# Patient Record
Sex: Male | Born: 1995 | Race: Black or African American | Hispanic: No
Health system: Midwestern US, Community
[De-identification: ages and names within clinical notes are randomized; demographics above are authoritative.]

---
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
## 2015-02-01 ENCOUNTER — Encounter: Payer: Self-pay | Admitting: Family Medicine

## 2015-02-01 ENCOUNTER — Ambulatory Visit (INDEPENDENT_AMBULATORY_CARE_PROVIDER_SITE_OTHER): Payer: Medicaid Other | Admitting: Family Medicine

## 2015-02-01 VITALS — BP 116/60 | HR 53 | Temp 98.2°F | Resp 16 | Ht 71.5 in | Wt 164.0 lb

## 2015-02-01 DIAGNOSIS — Z0000 Encounter for general adult medical examination without abnormal findings: Secondary | ICD-10-CM | POA: Diagnosis not present

## 2015-02-01 NOTE — Patient Instructions (Signed)
Health Maintenance - 17-19 Years Old SCHOOL PERFORMANCE After high school, you may attend college or technical or vocational school, enroll in the [employer], or enter the workforce. PHYSICAL, SOCIAL, AND EMOTIONAL DEVELOPMENT  One hour of regular physical activity daily is recommended. Continue to participate in sports.  Develop your own interests and consider community service or volunteerism.  Make decisions about college and work plans.  Throughout these years, you should assume responsibility for your own health care. Increasing independence is important for you.  You may be exploring your sexual identity. Understand that you should never be in a situation that makes you feel uncomfortable, and tell your partner if you do not want to engage in sexual activity.  Body image may become important to you. Be mindful that eating disorders can develop at this time. Talk to your parents or other caregivers if you have concerns about body image, weight gain, or losing weight.  You may notice mood disturbances, depression, anxiety, attention problems, or trouble with alcohol. Talk to your health care provider if you have concerns about mental illness.  Set limits for yourself and talk with your parents or other caregivers about independent decision making.  Handle conflict without physical violence.  Avoid loud noises which may impair hearing.  Limit television and computer time to 2 hours each day. Individuals who engage in excessive inactivity are more likely to become overweight. RECOMMENDED IMMUNIZATIONS  Influenza vaccine.  All adults should be immunized every year.  All adults, including pregnant women and people with hives-only allergy to eggs, can receive the inactivated influenza (IIV) vaccine.  Adults aged 18-49 years can receive the recombinant influenza (RIV) vaccine. The RIV vaccine does not contain any egg protein.  Tetanus, diphtheria, and acellular pertussis (Td, Tdap)  vaccine.  Pregnant women should receive 1 dose of Tdap vaccine during each pregnancy. The dose should be obtained regardless of the length of time since the last dose. Immunization is preferred during the 27th to 36th week of gestation.  An adult who has not previously received Tdap or who does not know his or her vaccine status should receive 1 dose of Tdap. This initial dose should be followed by tetanus and diphtheria toxoids (Td) booster doses every 10 years.  Adults with an unknown or incomplete history of completing a 3-dose immunization series with Td-containing vaccines should begin or complete a primary immunization series including a Tdap dose.  Adults should receive a Td booster every 10 years.  Varicella vaccine.  An adult without evidence of immunity to varicella should receive 2 doses or a second dose if he or she has previously received 1 dose.  Pregnant females who do not have evidence of immunity should receive the first dose after pregnancy. This first dose should be obtained before leaving the health care facility. The second dose should be obtained 4-8 weeks after the first dose.  Human papillomavirus (HPV) vaccine.  Females aged 13-26 years who have not received the vaccine previously should obtain the 3-dose series.  The vaccine is not recommended for pregnant females. However, pregnancy testing is not needed before receiving a dose. If a male is found to be pregnant after receiving a dose, no treatment is needed. In that case, the remaining doses should be delayed until after the pregnancy.  Males aged 67-21 years who have not received the vaccine previously should receive the 3-dose series. Males aged 22-26 years may be immunized.  Immunization is recommended through the age of 40 years for any  male who has sex with males and did not get any or all doses earlier.  Immunization is recommended for any person with an immunocompromised condition through the age of 69  years if he or she did not get any or all doses earlier.  During the 3-dose series, the second dose should be obtained 4-8 weeks after the first dose. The third dose should be obtained 24 weeks after the first dose and 16 weeks after the second dose.  Measles, mumps, and rubella (MMR) vaccine.  Adults born in [birth year] or later should have 1 or more doses of MMR vaccine unless there is a contraindication to the vaccine or there is laboratory evidence of immunity to each of the three diseases.  A routine second dose of MMR vaccine should be obtained at least 28 days after the first dose for students attending postsecondary schools, health care workers, and international travelers.  For females of childbearing age, rubella immunity should be determined. If there is no evidence of immunity, females who are not pregnant should be vaccinated. If there is no evidence of immunity, females who are pregnant should delay immunization until after pregnancy.  Pneumococcal 13-valent conjugate (PCV13) vaccine.  When indicated, a person who is uncertain of his or her immunization history and has no record of immunization should receive the PCV13 vaccine.  An adult aged 31 years or older who has certain medical conditions and has not been previously immunized should receive 1 dose of PCV13 vaccine. This PCV13 should be followed with a dose of pneumococcal polysaccharide (PPSV23) vaccine. The PPSV23 vaccine dose should be obtained at least 8 weeks after the dose of PCV13 vaccine.  An adult aged 88 years or older who has certain medical conditions and previously received 1 or more doses of PPSV23 vaccine should receive 1 dose of PCV13. The PCV13 vaccine dose should be obtained 1 or more years after the last PPSV23 vaccine dose.  Pneumococcal polysaccharide (PPSV23) vaccine.  When PCV13 is also indicated, PCV13 should be obtained first.  An adult younger than age 19 years who has certain medical conditions should be  immunized.  Any person who resides in a long-term care facility should be immunized.  An adult smoker should be immunized.  People with an immunocompromised condition and certain other conditions should receive both PCV13 and PPSV23 vaccines.  People with human immunodeficiency virus (HIV) infection should be immunized as soon as possible after diagnosis.  Immunization during chemotherapy or radiation therapy should be avoided.  Routine use of PPSV23 vaccine is not recommended for American Indians, [place] Natives, or people younger than 65 years unless there are medical conditions that require PPSV23 vaccine.  When indicated, people who have unknown immunization and have no record of immunization should receive PPSV23 vaccine.  One-time revaccination 5 years after the first dose of PPSV23 is recommended for people aged 19-64 years who have chronic kidney failure, nephrotic syndrome, asplenia, or immunocompromised conditions.  Meningococcal vaccine.  Adults with asplenia or persistent complement component deficiencies should receive 2 doses of quadrivalent meningococcal conjugate (MenACWY-D) vaccine. The doses should be obtained at least 2 months apart.  Microbiologists working with certain meningococcal bacteria, [place] recruits, people at risk during an outbreak, and people who travel to or live in countries with a high rate of meningitis should be immunized.  A first-year college student up through age 60 years who is living in a residence hall should receive a dose if he or she did not receive a dose on  or after his or her 16th birthday.  Adults who have certain high-risk conditions should receive one or more doses of vaccine.  Hepatitis A vaccine.  Adults who wish to be protected from this disease, have certain high-risk conditions, work with hepatitis A-infected animals, work in hepatitis A research labs, or travel to or work in countries with a high rate of hepatitis A should be  immunized.  Adults who were previously unvaccinated and who anticipate close contact with an international adoptee during the first 60 days after arrival in the [place] from a country with a high rate of hepatitis A should be immunized.  Hepatitis B vaccine.  Adults who wish to be protected from this disease, have certain high-risk conditions, may be exposed to blood or other infectious body fluids, are household contacts or sex partners of hepatitis B positive people, are clients or workers in certain care facilities, or travel to or work in countries with a high rate of hepatitis B should be immunized.  Haemophilus influenzae type b (Hib) vaccine.  A previously unvaccinated person with asplenia or sickle cell disease or having a scheduled splenectomy should receive 1 dose of Hib vaccine.  Regardless of previous immunization, a recipient of a hematopoietic stem cell transplant should receive a 3-dose series 6-12 months after his or her successful transplant.  Hib vaccine is not recommended for adults with HIV infection. TESTING  Annual screening for vision and hearing problems is recommended. Vision should be screened at least once between 18-21 years of age.  You may be screened for anemia or tuberculosis.  You should have a blood test to check for high cholesterol.  You should be screened for alcohol and drug use.  If you are sexually active, you may be screened for sexually transmitted infections (STIs), pregnancy, or HIV. You should be screened for STIs if:  Your sexual activity has changed since the last screening test, and you are at an increased risk for chlamydia or gonorrhea. Ask your health care provider if you are at risk.  If you are at an increased risk for hepatitis B, you should be screened for this virus. You are considered at high risk for hepatitis B if you:  Were born in a country where hepatitis B occurs often. Talk with your health care provider about which  countries are considered high risk.  Have parents who were born in a high-risk country and have not received a shot to protect against hepatitis B (hepatitis B vaccine).  Have HIV or AIDS.  Use needles to inject street drugs.  Live with or have sex with someone who has hepatitis B.  Are a man who has sex with other men (MSM).  Get hemodialysis treatment.  Take certain medicines for conditions like cancer, organ transplantation, or autoimmune conditions. NUTRITION   You should:  Have three servings of low-fat milk and dairy products daily. If you do not drink milk or consume dairy products, you should eat calcium-enriched foods, such as juice, bread, or cereal. Dark, leafy greens or canned fish are alternate sources of calcium.  Drink plenty of water. Fruit juice should be limited to 8-12 oz (240-360 mL) each day. Sugary beverages and sodas should be avoided.  Avoid eating foods high in fat, salt, or sugar, such as chips, candy, and cookies.  Avoid fast foods and limit eating out at restaurants.  Try not to skip meals, especially breakfast. You should eat a variety of vegetables, fruits, and lean meats.  Eat meals   together as a family whenever possible. ORAL HEALTH Brush your teeth twice a day and floss at least once a day. You should have two dental exams a year.  SKIN CARE You should wear sunscreen when out in the sun. TALK TO SOMEONE ABOUT:  Precautions against pregnancy, contraception, and sexually transmitted infections.  Taking a prescription medicine daily to prevent HIV infection if you are at risk of being infected with HIV. This is called preexposure prophylaxis (PrEP). You are at risk if you:  Are a male who has sex with other males (MSM).  Are heterosexual and sexually active with more than one partner.  Take drugs by injection.  Are sexually active with a partner who has HIV.  Whether you are at high risk of being infected with HIV. If you choose to begin  PrEP, you should first be tested for HIV. You should then be tested every 3 months for as long as you are taking PrEP.  Drug, tobacco, and alcohol use among your friends or at friends' homes. Smoking tobacco or marijuana and taking drugs have health consequences and may impact your brain development.  Appropriate use of over-the-counter or prescription medicines.  Driving guidelines and riding with friends.  The risks of drinking and driving or boating. Call someone if you have been drinking or using drugs and need a ride. WHAT'S NEXT? Visit your pediatrician or family physician once a year. By young adulthood, you should transition from your pediatrician to a family physician or internal medicine specialist. If you are a male and are sexually active, you may want to begin annual physical exams with a gynecologist. Document Released: 01/30/2007 Document Revised: 11/09/2013 Document Reviewed: 02/19/2007 [facility] Patient Information 2015 [place], [place]. This information is not intended to replace advice given to you by your health care provider. Make sure you discuss any questions you have with your health care provider. Preventive Care for Adults A healthy lifestyle and preventive care can promote health and wellness. Preventive health guidelines for men include the following key practices:  A routine yearly physical is a good way to check with your health care provider about your health and preventative screening. It is a chance to share any concerns and updates on your health and to receive a thorough exam.  Visit your dentist for a routine exam and preventative care every 6 months. Brush your teeth twice a day and floss once a day. Good oral hygiene prevents tooth decay and gum disease.  The frequency of eye exams is based on your age, health, family medical history, use of contact lenses, and other factors. Follow your health care provider's recommendations for frequency of eye  exams.  Eat a healthy diet. Foods such as vegetables, fruits, whole grains, low-fat dairy products, and lean protein foods contain the nutrients you need without too many calories. Decrease your intake of foods high in solid fats, added sugars, and salt. Eat the right amount of calories for you.Get information about a proper diet from your health care provider, if necessary.  Regular physical exercise is one of the most important things you can do for your health. Most adults should get at least 150 minutes of moderate-intensity exercise (any activity that increases your heart rate and causes you to sweat) each week. In addition, most adults need muscle-strengthening exercises on 2 or more days a week.  Maintain a healthy weight. The body mass index (BMI) is a screening tool to identify possible weight problems. It provides an estimate of body  fat based on height and weight. Your health care provider can find your BMI and can help you achieve or maintain a healthy weight.For adults 20 years and older:  A BMI below 18.5 is considered underweight.  A BMI of 18.5 to 24.9 is normal.  A BMI of 25 to 29.9 is considered overweight.  A BMI of 30 and above is considered obese.  Maintain normal blood lipids and cholesterol levels by exercising and minimizing your intake of saturated fat. Eat a balanced diet with plenty of fruit and vegetables. Blood tests for lipids and cholesterol should begin at age 70 and be repeated every 5 years. If your lipid or cholesterol levels are high, you are over 50, or you are at high risk for heart disease, you may need your cholesterol levels checked more frequently.Ongoing high lipid and cholesterol levels should be treated with medicines if diet and exercise are not working.  If you smoke, find out from your health care provider how to quit. If you do not use tobacco, do not start.  Lung cancer screening is recommended for adults aged 58-80 years who are at high risk  for developing lung cancer because of a history of smoking. A yearly low-dose CT scan of the lungs is recommended for people who have at least a 30-pack-year history of smoking and are a current smoker or have quit within the past 15 years. A pack year of smoking is smoking an average of 1 pack of cigarettes a day for 1 year (for example: 1 pack a day for 30 years or 2 packs a day for 15 years). Yearly screening should continue until the smoker has stopped smoking for at least 15 years. Yearly screening should be stopped for people who develop a health problem that would prevent them from having lung cancer treatment.  If you choose to drink alcohol, do not have more than 2 drinks per day. One drink is considered to be 12 ounces (355 mL) of beer, 5 ounces (148 mL) of wine, or 1.5 ounces (44 mL) of liquor.  Avoid use of street drugs. Do not share needles with anyone. Ask for help if you need support or instructions about stopping the use of drugs.  High blood pressure causes heart disease and increases the risk of stroke. Your blood pressure should be checked at least every 1-2 years. Ongoing high blood pressure should be treated with medicines, if weight loss and exercise are not effective.  If you are 71-37 years old, ask your health care provider if you should take aspirin to prevent heart disease.  Diabetes screening involves taking a blood sample to check your fasting blood sugar level. This should be done once every 3 years, after age 50, if you are within normal weight and without risk factors for diabetes. Testing should be considered at a younger age or be carried out more frequently if you are overweight and have at least 1 risk factor for diabetes.  Colorectal cancer can be detected and often prevented. Most routine colorectal cancer screening begins at the age of 77 and continues through age 94. However, your health care provider may recommend screening at an earlier age if you have risk  factors for colon cancer. On a yearly basis, your health care provider may provide home test kits to check for hidden blood in the stool. Use of a small camera at the end of a tube to directly examine the colon (sigmoidoscopy or colonoscopy) can detect the earliest forms of  colorectal cancer. Talk to your health care provider about this at age 70, when routine screening begins. Direct exam of the colon should be repeated every 5-10 years through age 41, unless early forms of precancerous polyps or small growths are found.  People who are at an increased risk for hepatitis B should be screened for this virus. You are considered at high risk for hepatitis B if:  You were born in a country where hepatitis B occurs often. Talk with your health care provider about which countries are considered high risk.  Your parents were born in a high-risk country and you have not received a shot to protect against hepatitis B (hepatitis B vaccine).  You have HIV or AIDS.  You use needles to inject street drugs.  You live with, or have sex with, someone who has hepatitis B.  You are a man who has sex with other men (MSM).  You get hemodialysis treatment.  You take certain medicines for conditions such as cancer, organ transplantation, and autoimmune conditions.  Hepatitis C blood testing is recommended for all people born from [birth year] through 1965 and any individual with known risks for hepatitis C.  Practice safe sex. Use condoms and avoid high-risk sexual practices to reduce the spread of sexually transmitted infections (STIs). STIs include gonorrhea, chlamydia, syphilis, trichomonas, herpes, HPV, and human immunodeficiency virus (HIV). Herpes, HIV, and HPV are viral illnesses that have no cure. They can result in disability, cancer, and death.  If you are at risk of being infected with HIV, it is recommended that you take a prescription medicine daily to prevent HIV infection. This is called preexposure  prophylaxis (PrEP). You are considered at risk if:  You are a man who has sex with other men (MSM) and have other risk factors.  You are a heterosexual man, are sexually active, and are at increased risk for HIV infection.  You take drugs by injection.  You are sexually active with a partner who has HIV.  Talk with your health care provider about whether you are at high risk of being infected with HIV. If you choose to begin PrEP, you should first be tested for HIV. You should then be tested every 3 months for as long as you are taking PrEP.  A one-time screening for abdominal aortic aneurysm (AAA) and surgical repair of large AAAs by ultrasound are recommended for men ages 65 to 39 years who are current or former smokers.  Healthy men should no longer receive prostate-specific antigen (PSA) blood tests as part of routine cancer screening. Talk with your health care provider about prostate cancer screening.  Testicular cancer screening is not recommended for adult males who have no symptoms. Screening includes self-exam, a health care provider exam, and other screening tests. Consult with your health care provider about any symptoms you have or any concerns you have about testicular cancer.  Use sunscreen. Apply sunscreen liberally and repeatedly throughout the day. You should seek shade when your shadow is shorter than you. Protect yourself by wearing long sleeves, pants, a wide-brimmed hat, and sunglasses year round, whenever you are outdoors.  Once a month, do a whole-body skin exam, using a mirror to look at the skin on your back. Tell your health care provider about new moles, moles that have irregular borders, moles that are larger than a pencil eraser, or moles that have changed in shape or color.  Stay current with required vaccines (immunizations).  Influenza vaccine. All adults should be immunized  every year.  Tetanus, diphtheria, and acellular pertussis (Td, Tdap) vaccine. An  adult who has not previously received Tdap or who does not know his vaccine status should receive 1 dose of Tdap. This initial dose should be followed by tetanus and diphtheria toxoids (Td) booster doses every 10 years. Adults with an unknown or incomplete history of completing a 3-dose immunization series with Td-containing vaccines should begin or complete a primary immunization series including a Tdap dose. Adults should receive a Td booster every 10 years.  Varicella vaccine. An adult without evidence of immunity to varicella should receive 2 doses or a second dose if he has previously received 1 dose.  Human papillomavirus (HPV) vaccine. Males aged 100-21 years who have not received the vaccine previously should receive the 3-dose series. Males aged 22-26 years may be immunized. Immunization is recommended through the age of 17 years for any male who has sex with males and did not get any or all doses earlier. Immunization is recommended for any person with an immunocompromised condition through the age of 50 years if he did not get any or all doses earlier. During the 3-dose series, the second dose should be obtained 4-8 weeks after the first dose. The third dose should be obtained 24 weeks after the first dose and 16 weeks after the second dose.  Zoster vaccine. One dose is recommended for adults aged 68 years or older unless certain conditions are present.  Measles, mumps, and rubella (MMR) vaccine. Adults born before [birth year] generally are considered immune to measles and mumps. Adults born in [birth year] or later should have 1 or more doses of MMR vaccine unless there is a contraindication to the vaccine or there is laboratory evidence of immunity to each of the three diseases. A routine second dose of MMR vaccine should be obtained at least 28 days after the first dose for students attending postsecondary schools, health care workers, or international travelers. People who received inactivated measles vaccine  or an unknown type of measles vaccine during 1963-1967 should receive 2 doses of MMR vaccine. People who received inactivated mumps vaccine or an unknown type of mumps vaccine before 1979 and are at high risk for mumps infection should consider immunization with 2 doses of MMR vaccine. Unvaccinated health care workers born before [birth year] who lack laboratory evidence of measles, mumps, or rubella immunity or laboratory confirmation of disease should consider measles and mumps immunization with 2 doses of MMR vaccine or rubella immunization with 1 dose of MMR vaccine.  Pneumococcal 13-valent conjugate (PCV13) vaccine. When indicated, a person who is uncertain of his immunization history and has no record of immunization should receive the PCV13 vaccine. An adult aged 51 years or older who has certain medical conditions and has not been previously immunized should receive 1 dose of PCV13 vaccine. This PCV13 should be followed with a dose of pneumococcal polysaccharide (PPSV23) vaccine. The PPSV23 vaccine dose should be obtained at least 8 weeks after the dose of PCV13 vaccine. An adult aged 90 years or older who has certain medical conditions and previously received 1 or more doses of PPSV23 vaccine should receive 1 dose of PCV13. The PCV13 vaccine dose should be obtained 1 or more years after the last PPSV23 vaccine dose.  Pneumococcal polysaccharide (PPSV23) vaccine. When PCV13 is also indicated, PCV13 should be obtained first. All adults aged 30 years and older should be immunized. An adult younger than age 33 years who has certain medical conditions should be immunized. Any person  who resides in a nursing home or long-term care facility should be immunized. An adult smoker should be immunized. People with an immunocompromised condition and certain other conditions should receive both PCV13 and PPSV23 vaccines. People with human immunodeficiency virus (HIV) infection should be immunized as soon as possible after  diagnosis. Immunization during chemotherapy or radiation therapy should be avoided. Routine use of PPSV23 vaccine is not recommended for American Indians, [place] Natives, or people younger than 65 years unless there are medical conditions that require PPSV23 vaccine. When indicated, people who have unknown immunization and have no record of immunization should receive PPSV23 vaccine. One-time revaccination 5 years after the first dose of PPSV23 is recommended for people aged 19-64 years who have chronic kidney failure, nephrotic syndrome, asplenia, or immunocompromised conditions. People who received 1-2 doses of PPSV23 before age 45 years should receive another dose of PPSV23 vaccine at age 60 years or later if at least 5 years have passed since the previous dose. Doses of PPSV23 are not needed for people immunized with PPSV23 at or after age 30 years.  Meningococcal vaccine. Adults with asplenia or persistent complement component deficiencies should receive 2 doses of quadrivalent meningococcal conjugate (MenACWY-D) vaccine. The doses should be obtained at least 2 months apart. Microbiologists working with certain meningococcal bacteria, [employer] recruits, people at risk during an outbreak, and people who travel to or live in countries with a high rate of meningitis should be immunized. A first-year college student up through age 75 years who is living in a residence hall should receive a dose if he did not receive a dose on or after his 16th birthday. Adults who have certain high-risk conditions should receive one or more doses of vaccine.  Hepatitis A vaccine. Adults who wish to be protected from this disease, have certain high-risk conditions, work with hepatitis A-infected animals, work in hepatitis A research labs, or travel to or work in countries with a high rate of hepatitis A should be immunized. Adults who were previously unvaccinated and who anticipate close contact with an international adoptee  during the first 60 days after arrival in the [place] from a country with a high rate of hepatitis A should be immunized.  Hepatitis B vaccine. Adults should be immunized if they wish to be protected from this disease, have certain high-risk conditions, may be exposed to blood or other infectious body fluids, are household contacts or sex partners of hepatitis B positive people, are clients or workers in certain care facilities, or travel to or work in countries with a high rate of hepatitis B.  Haemophilus influenzae type b (Hib) vaccine. A previously unvaccinated person with asplenia or sickle cell disease or having a scheduled splenectomy should receive 1 dose of Hib vaccine. Regardless of previous immunization, a recipient of a hematopoietic stem cell transplant should receive a 3-dose series 6-12 months after his successful transplant. Hib vaccine is not recommended for adults with HIV infection. Preventive Service / Frequency Ages 4 to 70  Blood pressure check.** / Every 1 to 2 years.  Lipid and cholesterol check.** / Every 5 years beginning at age 60.  Hepatitis C blood test.** / For any individual with known risks for hepatitis C.  Skin self-exam. / Monthly.  Influenza vaccine. / Every year.  Tetanus, diphtheria, and acellular pertussis (Tdap, Td) vaccine.** / Consult your health care provider. 1 dose of Td every 10 years.  Varicella vaccine.** / Consult your health care provider.  HPV vaccine. / 3 doses  over 6 months, if 75 or younger.  Measles, mumps, rubella (MMR) vaccine.** / You need at least 1 dose of MMR if you were born in 1957 or later. You may also need a second dose.  Pneumococcal 13-valent conjugate (PCV13) vaccine.** / Consult your health care provider.  Pneumococcal polysaccharide (PPSV23) vaccine.** / 1 to 2 doses if you smoke cigarettes or if you have certain conditions.  Meningococcal vaccine.** / 1 dose if you are age 75 to 13 years and a Occupational psychologist living in a residence hall, or have one of several medical conditions. You may also need additional booster doses.  Hepatitis A vaccine.** / Consult your health care provider.  Hepatitis B vaccine.** / Consult your health care provider.  Haemophilus influenzae type b (Hib) vaccine.** / Consult your health care provider. Ages 46 to 2  Blood pressure check.** / Every 1 to 2 years.  Lipid and cholesterol check.** / Every 5 years beginning at age 55.  Lung cancer screening. / Every year if you are aged 72-80 years and have a 30-pack-year history of smoking and currently smoke or have quit within the past 15 years. Yearly screening is stopped once you have quit smoking for at least 15 years or develop a health problem that would prevent you from having lung cancer treatment.  Fecal occult blood test (FOBT) of stool. / Every year beginning at age 12 and continuing until age 64. You may not have to do this test if you get a colonoscopy every 10 years.  Flexible sigmoidoscopy** or colonoscopy.** / Every 5 years for a flexible sigmoidoscopy or every 10 years for a colonoscopy beginning at age 48 and continuing until age 106.  Hepatitis C blood test.** / For all people born from [birth year] through 1965 and any individual with known risks for hepatitis C.  Skin self-exam. / Monthly.  Influenza vaccine. / Every year.  Tetanus, diphtheria, and acellular pertussis (Tdap/Td) vaccine.** / Consult your health care provider. 1 dose of Td every 10 years.  Varicella vaccine.** / Consult your health care provider.  Zoster vaccine.** / 1 dose for adults aged 9 years or older.  Measles, mumps, rubella (MMR) vaccine.** / You need at least 1 dose of MMR if you were born in 1957 or later. You may also need a second dose.  Pneumococcal 13-valent conjugate (PCV13) vaccine.** / Consult your health care provider.  Pneumococcal polysaccharide (PPSV23) vaccine.** / 1 to 2 doses if you smoke cigarettes  or if you have certain conditions.  Meningococcal vaccine.** / Consult your health care provider.  Hepatitis A vaccine.** / Consult your health care provider.  Hepatitis B vaccine.** / Consult your health care provider.  Haemophilus influenzae type b (Hib) vaccine.** / Consult your health care provider. Ages 46 and over  Blood pressure check.** / Every 1 to 2 years.  Lipid and cholesterol check.**/ Every 5 years beginning at age 51.  Lung cancer screening. / Every year if you are aged 80-80 years and have a 30-pack-year history of smoking and currently smoke or have quit within the past 15 years. Yearly screening is stopped once you have quit smoking for at least 15 years or develop a health problem that would prevent you from having lung cancer treatment.  Fecal occult blood test (FOBT) of stool. / Every year beginning at age 71 and continuing until age 26. You may not have to do this test if you get a colonoscopy every 10 years.  Flexible sigmoidoscopy** or colonoscopy.** /  Every 5 years for a flexible sigmoidoscopy or every 10 years for a colonoscopy beginning at age 63 and continuing until age 91.  Hepatitis C blood test.** / For all people born from [birth year] through 1965 and any individual with known risks for hepatitis C.  Abdominal aortic aneurysm (AAA) screening.** / A one-time screening for ages 34 to 67 years who are current or former smokers.  Skin self-exam. / Monthly.  Influenza vaccine. / Every year.  Tetanus, diphtheria, and acellular pertussis (Tdap/Td) vaccine.** / 1 dose of Td every 10 years.  Varicella vaccine.** / Consult your health care provider.  Zoster vaccine.** / 1 dose for adults aged 45 years or older.  Pneumococcal 13-valent conjugate (PCV13) vaccine.** / Consult your health care provider.  Pneumococcal polysaccharide (PPSV23) vaccine.** / 1 dose for all adults aged 34 years and older.  Meningococcal vaccine.** / Consult your health care  provider.  Hepatitis A vaccine.** / Consult your health care provider.  Hepatitis B vaccine.** / Consult your health care provider.  Haemophilus influenzae type b (Hib) vaccine.** / Consult your health care provider. **Family history and personal history of risk and conditions may change your health care provider's recommendations. Document Released: 12/31/2001 Document Revised: 11/09/2013 Document Reviewed: 04/01/2011 [facility] Patient Information 2015 [place], [place]. This information is not intended to replace advice given to you by your health care provider. Make sure you discuss any questions you have with your health care provider. Preventive Care for Adults A healthy lifestyle and preventive care can promote health and wellness. Preventive health guidelines for men include the following key practices:  A routine yearly physical is a good way to check with your health care provider about your health and preventative screening. It is a chance to share any concerns and updates on your health and to receive a thorough exam.  Visit your dentist for a routine exam and preventative care every 6 months. Brush your teeth twice a day and floss once a day. Good oral hygiene prevents tooth decay and gum disease.  The frequency of eye exams is based on your age, health, family medical history, use of contact lenses, and other factors. Follow your health care provider's recommendations for frequency of eye exams.  Eat a healthy diet. Foods such as vegetables, fruits, whole grains, low-fat dairy products, and lean protein foods contain the nutrients you need without too many calories. Decrease your intake of foods high in solid fats, added sugars, and salt. Eat the right amount of calories for you.Get information about a proper diet from your health care provider, if necessary.  Regular physical exercise is one of the most important things you can do for your health. Most adults should get at least 150  minutes of moderate-intensity exercise (any activity that increases your heart rate and causes you to sweat) each week. In addition, most adults need muscle-strengthening exercises on 2 or more days a week.  Maintain a healthy weight. The body mass index (BMI) is a screening tool to identify possible weight problems. It provides an estimate of body fat based on height and weight. Your health care provider can find your BMI and can help you achieve or maintain a healthy weight.For adults 20 years and older:  A BMI below 18.5 is considered underweight.  A BMI of 18.5 to 24.9 is normal.  A BMI of 25 to 29.9 is considered overweight.  A BMI of 30 and above is considered obese.  Maintain normal blood lipids and cholesterol levels by exercising  and minimizing your intake of saturated fat. Eat a balanced diet with plenty of fruit and vegetables. Blood tests for lipids and cholesterol should begin at age 31 and be repeated every 5 years. If your lipid or cholesterol levels are high, you are over 50, or you are at high risk for heart disease, you may need your cholesterol levels checked more frequently.Ongoing high lipid and cholesterol levels should be treated with medicines if diet and exercise are not working.  If you smoke, find out from your health care provider how to quit. If you do not use tobacco, do not start.  Lung cancer screening is recommended for adults aged 5-80 years who are at high risk for developing lung cancer because of a history of smoking. A yearly low-dose CT scan of the lungs is recommended for people who have at least a 30-pack-year history of smoking and are a current smoker or have quit within the past 15 years. A pack year of smoking is smoking an average of 1 pack of cigarettes a day for 1 year (for example: 1 pack a day for 30 years or 2 packs a day for 15 years). Yearly screening should continue until the smoker has stopped smoking for at least 15 years. Yearly screening  should be stopped for people who develop a health problem that would prevent them from having lung cancer treatment.  If you choose to drink alcohol, do not have more than 2 drinks per day. One drink is considered to be 12 ounces (355 mL) of beer, 5 ounces (148 mL) of wine, or 1.5 ounces (44 mL) of liquor.  Avoid use of street drugs. Do not share needles with anyone. Ask for help if you need support or instructions about stopping the use of drugs.  High blood pressure causes heart disease and increases the risk of stroke. Your blood pressure should be checked at least every 1-2 years. Ongoing high blood pressure should be treated with medicines, if weight loss and exercise are not effective.  If you are 16-61 years old, ask your health care provider if you should take aspirin to prevent heart disease.  Diabetes screening involves taking a blood sample to check your fasting blood sugar level. This should be done once every 3 years, after age 38, if you are within normal weight and without risk factors for diabetes. Testing should be considered at a younger age or be carried out more frequently if you are overweight and have at least 1 risk factor for diabetes.  Colorectal cancer can be detected and often prevented. Most routine colorectal cancer screening begins at the age of 62 and continues through age 83. However, your health care provider may recommend screening at an earlier age if you have risk factors for colon cancer. On a yearly basis, your health care provider may provide home test kits to check for hidden blood in the stool. Use of a small camera at the end of a tube to directly examine the colon (sigmoidoscopy or colonoscopy) can detect the earliest forms of colorectal cancer. Talk to your health care provider about this at age 26, when routine screening begins. Direct exam of the colon should be repeated every 5-10 years through age 32, unless early forms of precancerous polyps or small  growths are found.  People who are at an increased risk for hepatitis B should be screened for this virus. You are considered at high risk for hepatitis B if:  You were born in a country  where hepatitis B occurs often. Talk with your health care provider about which countries are considered high risk.  Your parents were born in a high-risk country and you have not received a shot to protect against hepatitis B (hepatitis B vaccine).  You have HIV or AIDS.  You use needles to inject street drugs.  You live with, or have sex with, someone who has hepatitis B.  You are a man who has sex with other men (MSM).  You get hemodialysis treatment.  You take certain medicines for conditions such as cancer, organ transplantation, and autoimmune conditions.  Hepatitis C blood testing is recommended for all people born from [birth year] through 1965 and any individual with known risks for hepatitis C.  Practice safe sex. Use condoms and avoid high-risk sexual practices to reduce the spread of sexually transmitted infections (STIs). STIs include gonorrhea, chlamydia, syphilis, trichomonas, herpes, HPV, and human immunodeficiency virus (HIV). Herpes, HIV, and HPV are viral illnesses that have no cure. They can result in disability, cancer, and death.  If you are at risk of being infected with HIV, it is recommended that you take a prescription medicine daily to prevent HIV infection. This is called preexposure prophylaxis (PrEP). You are considered at risk if:  You are a man who has sex with other men (MSM) and have other risk factors.  You are a heterosexual man, are sexually active, and are at increased risk for HIV infection.  You take drugs by injection.  You are sexually active with a partner who has HIV.  Talk with your health care provider about whether you are at high risk of being infected with HIV. If you choose to begin PrEP, you should first be tested for HIV. You should then be tested every 3  months for as long as you are taking PrEP.  A one-time screening for abdominal aortic aneurysm (AAA) and surgical repair of large AAAs by ultrasound are recommended for men ages 53 to 67 years who are current or former smokers.  Healthy men should no longer receive prostate-specific antigen (PSA) blood tests as part of routine cancer screening. Talk with your health care provider about prostate cancer screening.  Testicular cancer screening is not recommended for adult males who have no symptoms. Screening includes self-exam, a health care provider exam, and other screening tests. Consult with your health care provider about any symptoms you have or any concerns you have about testicular cancer.  Use sunscreen. Apply sunscreen liberally and repeatedly throughout the day. You should seek shade when your shadow is shorter than you. Protect yourself by wearing long sleeves, pants, a wide-brimmed hat, and sunglasses year round, whenever you are outdoors.  Once a month, do a whole-body skin exam, using a mirror to look at the skin on your back. Tell your health care provider about new moles, moles that have irregular borders, moles that are larger than a pencil eraser, or moles that have changed in shape or color.  Stay current with required vaccines (immunizations).  Influenza vaccine. All adults should be immunized every year.  Tetanus, diphtheria, and acellular pertussis (Td, Tdap) vaccine. An adult who has not previously received Tdap or who does not know his vaccine status should receive 1 dose of Tdap. This initial dose should be followed by tetanus and diphtheria toxoids (Td) booster doses every 10 years. Adults with an unknown or incomplete history of completing a 3-dose immunization series with Td-containing vaccines should begin or complete a primary immunization series including a  Tdap dose. Adults should receive a Td booster every 10 years.  Varicella vaccine. An adult without evidence of  immunity to varicella should receive 2 doses or a second dose if he has previously received 1 dose.  Human papillomavirus (HPV) vaccine. Males aged 52-21 years who have not received the vaccine previously should receive the 3-dose series. Males aged 22-26 years may be immunized. Immunization is recommended through the age of 75 years for any male who has sex with males and did not get any or all doses earlier. Immunization is recommended for any person with an immunocompromised condition through the age of 56 years if he did not get any or all doses earlier. During the 3-dose series, the second dose should be obtained 4-8 weeks after the first dose. The third dose should be obtained 24 weeks after the first dose and 16 weeks after the second dose.  Zoster vaccine. One dose is recommended for adults aged 20 years or older unless certain conditions are present.  Measles, mumps, and rubella (MMR) vaccine. Adults born before [birth year] generally are considered immune to measles and mumps. Adults born in [birth year] or later should have 1 or more doses of MMR vaccine unless there is a contraindication to the vaccine or there is laboratory evidence of immunity to each of the three diseases. A routine second dose of MMR vaccine should be obtained at least 28 days after the first dose for students attending postsecondary schools, health care workers, or international travelers. People who received inactivated measles vaccine or an unknown type of measles vaccine during 1963-1967 should receive 2 doses of MMR vaccine. People who received inactivated mumps vaccine or an unknown type of mumps vaccine before 1979 and are at high risk for mumps infection should consider immunization with 2 doses of MMR vaccine. Unvaccinated health care workers born before [birth year] who lack laboratory evidence of measles, mumps, or rubella immunity or laboratory confirmation of disease should consider measles and mumps immunization with 2 doses of MMR  vaccine or rubella immunization with 1 dose of MMR vaccine.  Pneumococcal 13-valent conjugate (PCV13) vaccine. When indicated, a person who is uncertain of his immunization history and has no record of immunization should receive the PCV13 vaccine. An adult aged 87 years or older who has certain medical conditions and has not been previously immunized should receive 1 dose of PCV13 vaccine. This PCV13 should be followed with a dose of pneumococcal polysaccharide (PPSV23) vaccine. The PPSV23 vaccine dose should be obtained at least 8 weeks after the dose of PCV13 vaccine. An adult aged 76 years or older who has certain medical conditions and previously received 1 or more doses of PPSV23 vaccine should receive 1 dose of PCV13. The PCV13 vaccine dose should be obtained 1 or more years after the last PPSV23 vaccine dose.  Pneumococcal polysaccharide (PPSV23) vaccine. When PCV13 is also indicated, PCV13 should be obtained first. All adults aged 68 years and older should be immunized. An adult younger than age 39 years who has certain medical conditions should be immunized. Any person who resides in a nursing home or long-term care facility should be immunized. An adult smoker should be immunized. People with an immunocompromised condition and certain other conditions should receive both PCV13 and PPSV23 vaccines. People with human immunodeficiency virus (HIV) infection should be immunized as soon as possible after diagnosis. Immunization during chemotherapy or radiation therapy should be avoided. Routine use of PPSV23 vaccine is not recommended for American Indians, [place] Natives, or people younger  than 65 years unless there are medical conditions that require PPSV23 vaccine. When indicated, people who have unknown immunization and have no record of immunization should receive PPSV23 vaccine. One-time revaccination 5 years after the first dose of PPSV23 is recommended for people aged 19-64 years who have chronic  kidney failure, nephrotic syndrome, asplenia, or immunocompromised conditions. People who received 1-2 doses of PPSV23 before age 8 years should receive another dose of PPSV23 vaccine at age 82 years or later if at least 5 years have passed since the previous dose. Doses of PPSV23 are not needed for people immunized with PPSV23 at or after age 78 years.  Meningococcal vaccine. Adults with asplenia or persistent complement component deficiencies should receive 2 doses of quadrivalent meningococcal conjugate (MenACWY-D) vaccine. The doses should be obtained at least 2 months apart. Microbiologists working with certain meningococcal bacteria, [employer] recruits, people at risk during an outbreak, and people who travel to or live in countries with a high rate of meningitis should be immunized. A first-year college student up through age 44 years who is living in a residence hall should receive a dose if he did not receive a dose on or after his 16th birthday. Adults who have certain high-risk conditions should receive one or more doses of vaccine.  Hepatitis A vaccine. Adults who wish to be protected from this disease, have certain high-risk conditions, work with hepatitis A-infected animals, work in hepatitis A research labs, or travel to or work in countries with a high rate of hepatitis A should be immunized. Adults who were previously unvaccinated and who anticipate close contact with an international adoptee during the first 60 days after arrival in the [place] from a country with a high rate of hepatitis A should be immunized.  Hepatitis B vaccine. Adults should be immunized if they wish to be protected from this disease, have certain high-risk conditions, may be exposed to blood or other infectious body fluids, are household contacts or sex partners of hepatitis B positive people, are clients or workers in certain care facilities, or travel to or work in countries with a high rate of hepatitis  B.  Haemophilus influenzae type b (Hib) vaccine. A previously unvaccinated person with asplenia or sickle cell disease or having a scheduled splenectomy should receive 1 dose of Hib vaccine. Regardless of previous immunization, a recipient of a hematopoietic stem cell transplant should receive a 3-dose series 6-12 months after his successful transplant. Hib vaccine is not recommended for adults with HIV infection. Preventive Service / Frequency Ages 10 to 53  Blood pressure check.** / Every 1 to 2 years.  Lipid and cholesterol check.** / Every 5 years beginning at age 68.  Hepatitis C blood test.** / For any individual with known risks for hepatitis C.  Skin self-exam. / Monthly.  Influenza vaccine. / Every year.  Tetanus, diphtheria, and acellular pertussis (Tdap, Td) vaccine.** / Consult your health care provider. 1 dose of Td every 10 years.  Varicella vaccine.** / Consult your health care provider.  HPV vaccine. / 3 doses over 6 months, if 6 or younger.  Measles, mumps, rubella (MMR) vaccine.** / You need at least 1 dose of MMR if you were born in 1957 or later. You may also need a second dose.  Pneumococcal 13-valent conjugate (PCV13) vaccine.** / Consult your health care provider.  Pneumococcal polysaccharide (PPSV23) vaccine.** / 1 to 2 doses if you smoke cigarettes or if you have certain conditions.  Meningococcal vaccine.** / 1 dose if  you are age 65 to 18 years and a Market researcher living in a residence hall, or have one of several medical conditions. You may also need additional booster doses.  Hepatitis A vaccine.** / Consult your health care provider.  Hepatitis B vaccine.** / Consult your health care provider.  Haemophilus influenzae type b (Hib) vaccine.** / Consult your health care provider. Ages 87 to 72  Blood pressure check.** / Every 1 to 2 years.  Lipid and cholesterol check.** / Every 5 years beginning at age 56.  Lung cancer screening. /  Every year if you are aged 89-80 years and have a 30-pack-year history of smoking and currently smoke or have quit within the past 15 years. Yearly screening is stopped once you have quit smoking for at least 15 years or develop a health problem that would prevent you from having lung cancer treatment.  Fecal occult blood test (FOBT) of stool. / Every year beginning at age 59 and continuing until age 55. You may not have to do this test if you get a colonoscopy every 10 years.  Flexible sigmoidoscopy** or colonoscopy.** / Every 5 years for a flexible sigmoidoscopy or every 10 years for a colonoscopy beginning at age 69 and continuing until age 19.  Hepatitis C blood test.** / For all people born from [birth year] through 1965 and any individual with known risks for hepatitis C.  Skin self-exam. / Monthly.  Influenza vaccine. / Every year.  Tetanus, diphtheria, and acellular pertussis (Tdap/Td) vaccine.** / Consult your health care provider. 1 dose of Td every 10 years.  Varicella vaccine.** / Consult your health care provider.  Zoster vaccine.** / 1 dose for adults aged 53 years or older.  Measles, mumps, rubella (MMR) vaccine.** / You need at least 1 dose of MMR if you were born in 1957 or later. You may also need a second dose.  Pneumococcal 13-valent conjugate (PCV13) vaccine.** / Consult your health care provider.  Pneumococcal polysaccharide (PPSV23) vaccine.** / 1 to 2 doses if you smoke cigarettes or if you have certain conditions.  Meningococcal vaccine.** / Consult your health care provider.  Hepatitis A vaccine.** / Consult your health care provider.  Hepatitis B vaccine.** / Consult your health care provider.  Haemophilus influenzae type b (Hib) vaccine.** / Consult your health care provider. Ages 39 and over  Blood pressure check.** / Every 1 to 2 years.  Lipid and cholesterol check.**/ Every 5 years beginning at age 83.  Lung cancer screening. / Every year if you are aged  52-80 years and have a 30-pack-year history of smoking and currently smoke or have quit within the past 15 years. Yearly screening is stopped once you have quit smoking for at least 15 years or develop a health problem that would prevent you from having lung cancer treatment.  Fecal occult blood test (FOBT) of stool. / Every year beginning at age 3 and continuing until age 33. You may not have to do this test if you get a colonoscopy every 10 years.  Flexible sigmoidoscopy** or colonoscopy.** / Every 5 years for a flexible sigmoidoscopy or every 10 years for a colonoscopy beginning at age 68 and continuing until age 49.  Hepatitis C blood test.** / For all people born from [birth year] through 1965 and any individual with known risks for hepatitis C.  Abdominal aortic aneurysm (AAA) screening.** / A one-time screening for ages 14 to 2 years who are current or former smokers.  Skin self-exam. / Monthly.  Influenza  vaccine. / Every year.  Tetanus, diphtheria, and acellular pertussis (Tdap/Td) vaccine.** / 1 dose of Td every 10 years.  Varicella vaccine.** / Consult your health care provider.  Zoster vaccine.** / 1 dose for adults aged 16 years or older.  Pneumococcal 13-valent conjugate (PCV13) vaccine.** / Consult your health care provider.  Pneumococcal polysaccharide (PPSV23) vaccine.** / 1 dose for all adults aged 66 years and older.  Meningococcal vaccine.** / Consult your health care provider.  Hepatitis A vaccine.** / Consult your health care provider.  Hepatitis B vaccine.** / Consult your health care provider.  Haemophilus influenzae type b (Hib) vaccine.** / Consult your health care provider. **Family history and personal history of risk and conditions may change your health care provider's recommendations. Document Released: 12/31/2001 Document Revised: 11/09/2013 Document Reviewed: 04/01/2011 [facility] Patient Information 2015 [place], [place]. This information is not intended to  replace advice given to you by your health care provider. Make sure you discuss any questions you have with your health care provider. Preventive Care for Adults A healthy lifestyle and preventive care can promote health and wellness. Preventive health guidelines for men include the following key practices:  A routine yearly physical is a good way to check with your health care provider about your health and preventative screening. It is a chance to share any concerns and updates on your health and to receive a thorough exam.  Visit your dentist for a routine exam and preventative care every 6 months. Brush your teeth twice a day and floss once a day. Good oral hygiene prevents tooth decay and gum disease.  The frequency of eye exams is based on your age, health, family medical history, use of contact lenses, and other factors. Follow your health care provider's recommendations for frequency of eye exams.  Eat a healthy diet. Foods such as vegetables, fruits, whole grains, low-fat dairy products, and lean protein foods contain the nutrients you need without too many calories. Decrease your intake of foods high in solid fats, added sugars, and salt. Eat the right amount of calories for you.Get information about a proper diet from your health care provider, if necessary.  Regular physical exercise is one of the most important things you can do for your health. Most adults should get at least 150 minutes of moderate-intensity exercise (any activity that increases your heart rate and causes you to sweat) each week. In addition, most adults need muscle-strengthening exercises on 2 or more days a week.  Maintain a healthy weight. The body mass index (BMI) is a screening tool to identify possible weight problems. It provides an estimate of body fat based on height and weight. Your health care provider can find your BMI and can help you achieve or maintain a healthy weight.For adults 20 years and older:  A  BMI below 18.5 is considered underweight.  A BMI of 18.5 to 24.9 is normal.  A BMI of 25 to 29.9 is considered overweight.  A BMI of 30 and above is considered obese.  Maintain normal blood lipids and cholesterol levels by exercising and minimizing your intake of saturated fat. Eat a balanced diet with plenty of fruit and vegetables. Blood tests for lipids and cholesterol should begin at age 66 and be repeated every 5 years. If your lipid or cholesterol levels are high, you are over 50, or you are at high risk for heart disease, you may need your cholesterol levels checked more frequently.Ongoing high lipid and cholesterol levels should be treated with medicines if  diet and exercise are not working.  If you smoke, find out from your health care provider how to quit. If you do not use tobacco, do not start.  Lung cancer screening is recommended for adults aged 11-80 years who are at high risk for developing lung cancer because of a history of smoking. A yearly low-dose CT scan of the lungs is recommended for people who have at least a 30-pack-year history of smoking and are a current smoker or have quit within the past 15 years. A pack year of smoking is smoking an average of 1 pack of cigarettes a day for 1 year (for example: 1 pack a day for 30 years or 2 packs a day for 15 years). Yearly screening should continue until the smoker has stopped smoking for at least 15 years. Yearly screening should be stopped for people who develop a health problem that would prevent them from having lung cancer treatment.  If you choose to drink alcohol, do not have more than 2 drinks per day. One drink is considered to be 12 ounces (355 mL) of beer, 5 ounces (148 mL) of wine, or 1.5 ounces (44 mL) of liquor.  Avoid use of street drugs. Do not share needles with anyone. Ask for help if you need support or instructions about stopping the use of drugs.  High blood pressure causes heart disease and increases the risk  of stroke. Your blood pressure should be checked at least every 1-2 years. Ongoing high blood pressure should be treated with medicines, if weight loss and exercise are not effective.  If you are 45-82 years old, ask your health care provider if you should take aspirin to prevent heart disease.  Diabetes screening involves taking a blood sample to check your fasting blood sugar level. This should be done once every 3 years, after age 68, if you are within normal weight and without risk factors for diabetes. Testing should be considered at a younger age or be carried out more frequently if you are overweight and have at least 1 risk factor for diabetes.  Colorectal cancer can be detected and often prevented. Most routine colorectal cancer screening begins at the age of 28 and continues through age 53. However, your health care provider may recommend screening at an earlier age if you have risk factors for colon cancer. On a yearly basis, your health care provider may provide home test kits to check for hidden blood in the stool. Use of a small camera at the end of a tube to directly examine the colon (sigmoidoscopy or colonoscopy) can detect the earliest forms of colorectal cancer. Talk to your health care provider about this at age 14, when routine screening begins. Direct exam of the colon should be repeated every 5-10 years through age 63, unless early forms of precancerous polyps or small growths are found.  People who are at an increased risk for hepatitis B should be screened for this virus. You are considered at high risk for hepatitis B if:  You were born in a country where hepatitis B occurs often. Talk with your health care provider about which countries are considered high risk.  Your parents were born in a high-risk country and you have not received a shot to protect against hepatitis B (hepatitis B vaccine).  You have HIV or AIDS.  You use needles to inject street drugs.  You live with,  or have sex with, someone who has hepatitis B.  You are a man who  has sex with other men (MSM).  You get hemodialysis treatment.  You take certain medicines for conditions such as cancer, organ transplantation, and autoimmune conditions.  Hepatitis C blood testing is recommended for all people born from [birth year] through 1965 and any individual with known risks for hepatitis C.  Practice safe sex. Use condoms and avoid high-risk sexual practices to reduce the spread of sexually transmitted infections (STIs). STIs include gonorrhea, chlamydia, syphilis, trichomonas, herpes, HPV, and human immunodeficiency virus (HIV). Herpes, HIV, and HPV are viral illnesses that have no cure. They can result in disability, cancer, and death.  If you are at risk of being infected with HIV, it is recommended that you take a prescription medicine daily to prevent HIV infection. This is called preexposure prophylaxis (PrEP). You are considered at risk if:  You are a man who has sex with other men (MSM) and have other risk factors.  You are a heterosexual man, are sexually active, and are at increased risk for HIV infection.  You take drugs by injection.  You are sexually active with a partner who has HIV.  Talk with your health care provider about whether you are at high risk of being infected with HIV. If you choose to begin PrEP, you should first be tested for HIV. You should then be tested every 3 months for as long as you are taking PrEP.  A one-time screening for abdominal aortic aneurysm (AAA) and surgical repair of large AAAs by ultrasound are recommended for men ages 32 to 24 years who are current or former smokers.  Healthy men should no longer receive prostate-specific antigen (PSA) blood tests as part of routine cancer screening. Talk with your health care provider about prostate cancer screening.  Testicular cancer screening is not recommended for adult males who have no symptoms. Screening includes  self-exam, a health care provider exam, and other screening tests. Consult with your health care provider about any symptoms you have or any concerns you have about testicular cancer.  Use sunscreen. Apply sunscreen liberally and repeatedly throughout the day. You should seek shade when your shadow is shorter than you. Protect yourself by wearing long sleeves, pants, a wide-brimmed hat, and sunglasses year round, whenever you are outdoors.  Once a month, do a whole-body skin exam, using a mirror to look at the skin on your back. Tell your health care provider about new moles, moles that have irregular borders, moles that are larger than a pencil eraser, or moles that have changed in shape or color.  Stay current with required vaccines (immunizations).  Influenza vaccine. All adults should be immunized every year.  Tetanus, diphtheria, and acellular pertussis (Td, Tdap) vaccine. An adult who has not previously received Tdap or who does not know his vaccine status should receive 1 dose of Tdap. This initial dose should be followed by tetanus and diphtheria toxoids (Td) booster doses every 10 years. Adults with an unknown or incomplete history of completing a 3-dose immunization series with Td-containing vaccines should begin or complete a primary immunization series including a Tdap dose. Adults should receive a Td booster every 10 years.  Varicella vaccine. An adult without evidence of immunity to varicella should receive 2 doses or a second dose if he has previously received 1 dose.  Human papillomavirus (HPV) vaccine. Males aged 56-21 years who have not received the vaccine previously should receive the 3-dose series. Males aged 22-26 years may be immunized. Immunization is recommended through the age of 62 years for  any male who has sex with males and did not get any or all doses earlier. Immunization is recommended for any person with an immunocompromised condition through the age of 24 years if he  did not get any or all doses earlier. During the 3-dose series, the second dose should be obtained 4-8 weeks after the first dose. The third dose should be obtained 24 weeks after the first dose and 16 weeks after the second dose.  Zoster vaccine. One dose is recommended for adults aged 51 years or older unless certain conditions are present.  Measles, mumps, and rubella (MMR) vaccine. Adults born before [birth year] generally are considered immune to measles and mumps. Adults born in [birth year] or later should have 1 or more doses of MMR vaccine unless there is a contraindication to the vaccine or there is laboratory evidence of immunity to each of the three diseases. A routine second dose of MMR vaccine should be obtained at least 28 days after the first dose for students attending postsecondary schools, health care workers, or international travelers. People who received inactivated measles vaccine or an unknown type of measles vaccine during 1963-1967 should receive 2 doses of MMR vaccine. People who received inactivated mumps vaccine or an unknown type of mumps vaccine before 1979 and are at high risk for mumps infection should consider immunization with 2 doses of MMR vaccine. Unvaccinated health care workers born before 40 who lack laboratory evidence of measles, mumps, or rubella immunity or laboratory confirmation of disease should consider measles and mumps immunization with 2 doses of MMR vaccine or rubella immunization with 1 dose of MMR vaccine.  Pneumococcal 13-valent conjugate (PCV13) vaccine. When indicated, a person who is uncertain of his immunization history and has no record of immunization should receive the PCV13 vaccine. An adult aged 46 years or older who has certain medical conditions and has not been previously immunized should receive 1 dose of PCV13 vaccine. This PCV13 should be followed with a dose of pneumococcal polysaccharide (PPSV23) vaccine. The PPSV23 vaccine dose should be obtained at  least 8 weeks after the dose of PCV13 vaccine. An adult aged 59 years or older who has certain medical conditions and previously received 1 or more doses of PPSV23 vaccine should receive 1 dose of PCV13. The PCV13 vaccine dose should be obtained 1 or more years after the last PPSV23 vaccine dose.  Pneumococcal polysaccharide (PPSV23) vaccine. When PCV13 is also indicated, PCV13 should be obtained first. All adults aged 30 years and older should be immunized. An adult younger than age 41 years who has certain medical conditions should be immunized. Any person who resides in a nursing home or long-term care facility should be immunized. An adult smoker should be immunized. People with an immunocompromised condition and certain other conditions should receive both PCV13 and PPSV23 vaccines. People with human immunodeficiency virus (HIV) infection should be immunized as soon as possible after diagnosis. Immunization during chemotherapy or radiation therapy should be avoided. Routine use of PPSV23 vaccine is not recommended for American Indians, [place] Natives, or people younger than 65 years unless there are medical conditions that require PPSV23 vaccine. When indicated, people who have unknown immunization and have no record of immunization should receive PPSV23 vaccine. One-time revaccination 5 years after the first dose of PPSV23 is recommended for people aged 19-64 years who have chronic kidney failure, nephrotic syndrome, asplenia, or immunocompromised conditions. People who received 1-2 doses of PPSV23 before age 51 years should receive another dose of PPSV23 vaccine  at age 43 years or later if at least 5 years have passed since the previous dose. Doses of PPSV23 are not needed for people immunized with PPSV23 at or after age 11 years.  Meningococcal vaccine. Adults with asplenia or persistent complement component deficiencies should receive 2 doses of quadrivalent meningococcal conjugate (MenACWY-D) vaccine.  The doses should be obtained at least 2 months apart. Microbiologists working with certain meningococcal bacteria, [place] recruits, people at risk during an outbreak, and people who travel to or live in countries with a high rate of meningitis should be immunized. A first-year college student up through age 19 years who is living in a residence hall should receive a dose if he did not receive a dose on or after his 16th birthday. Adults who have certain high-risk conditions should receive one or more doses of vaccine.  Hepatitis A vaccine. Adults who wish to be protected from this disease, have certain high-risk conditions, work with hepatitis A-infected animals, work in hepatitis A research labs, or travel to or work in countries with a high rate of hepatitis A should be immunized. Adults who were previously unvaccinated and who anticipate close contact with an international adoptee during the first 60 days after arrival in the [place] from a country with a high rate of hepatitis A should be immunized.  Hepatitis B vaccine. Adults should be immunized if they wish to be protected from this disease, have certain high-risk conditions, may be exposed to blood or other infectious body fluids, are household contacts or sex partners of hepatitis B positive people, are clients or workers in certain care facilities, or travel to or work in countries with a high rate of hepatitis B.  Haemophilus influenzae type b (Hib) vaccine. A previously unvaccinated person with asplenia or sickle cell disease or having a scheduled splenectomy should receive 1 dose of Hib vaccine. Regardless of previous immunization, a recipient of a hematopoietic stem cell transplant should receive a 3-dose series 6-12 months after his successful transplant. Hib vaccine is not recommended for adults with HIV infection. Preventive Service / Frequency Ages 18 to 43  Blood pressure check.** / Every 1 to 2 years.  Lipid and cholesterol  check.** / Every 5 years beginning at age 16.  Hepatitis C blood test.** / For any individual with known risks for hepatitis C.  Skin self-exam. / Monthly.  Influenza vaccine. / Every year.  Tetanus, diphtheria, and acellular pertussis (Tdap, Td) vaccine.** / Consult your health care provider. 1 dose of Td every 10 years.  Varicella vaccine.** / Consult your health care provider.  HPV vaccine. / 3 doses over 6 months, if 74 or younger.  Measles, mumps, rubella (MMR) vaccine.** / You need at least 1 dose of MMR if you were born in 1957 or later. You may also need a second dose.  Pneumococcal 13-valent conjugate (PCV13) vaccine.** / Consult your health care provider.  Pneumococcal polysaccharide (PPSV23) vaccine.** / 1 to 2 doses if you smoke cigarettes or if you have certain conditions.  Meningococcal vaccine.** / 1 dose if you are age 28 to 39 years and a Market researcher living in a residence hall, or have one of several medical conditions. You may also need additional booster doses.  Hepatitis A vaccine.** / Consult your health care provider.  Hepatitis B vaccine.** / Consult your health care provider.  Haemophilus influenzae type b (Hib) vaccine.** / Consult your health care provider. Ages 62 to 61  Blood pressure check.** / Every 1  to 2 years.  Lipid and cholesterol check.** / Every 5 years beginning at age 19.  Lung cancer screening. / Every year if you are aged 2-80 years and have a 30-pack-year history of smoking and currently smoke or have quit within the past 15 years. Yearly screening is stopped once you have quit smoking for at least 15 years or develop a health problem that would prevent you from having lung cancer treatment.  Fecal occult blood test (FOBT) of stool. / Every year beginning at age 29 and continuing until age 61. You may not have to do this test if you get a colonoscopy every 10 years.  Flexible sigmoidoscopy** or colonoscopy.** / Every 5  years for a flexible sigmoidoscopy or every 10 years for a colonoscopy beginning at age 70 and continuing until age 29.  Hepatitis C blood test.** / For all people born from [birth year] through 1965 and any individual with known risks for hepatitis C.  Skin self-exam. / Monthly.  Influenza vaccine. / Every year.  Tetanus, diphtheria, and acellular pertussis (Tdap/Td) vaccine.** / Consult your health care provider. 1 dose of Td every 10 years.  Varicella vaccine.** / Consult your health care provider.  Zoster vaccine.** / 1 dose for adults aged 72 years or older.  Measles, mumps, rubella (MMR) vaccine.** / You need at least 1 dose of MMR if you were born in 1957 or later. You may also need a second dose.  Pneumococcal 13-valent conjugate (PCV13) vaccine.** / Consult your health care provider.  Pneumococcal polysaccharide (PPSV23) vaccine.** / 1 to 2 doses if you smoke cigarettes or if you have certain conditions.  Meningococcal vaccine.** / Consult your health care provider.  Hepatitis A vaccine.** / Consult your health care provider.  Hepatitis B vaccine.** / Consult your health care provider.  Haemophilus influenzae type b (Hib) vaccine.** / Consult your health care provider. Ages 30 and over  Blood pressure check.** / Every 1 to 2 years.  Lipid and cholesterol check.**/ Every 5 years beginning at age 75.  Lung cancer screening. / Every year if you are aged 42-80 years and have a 30-pack-year history of smoking and currently smoke or have quit within the past 15 years. Yearly screening is stopped once you have quit smoking for at least 15 years or develop a health problem that would prevent you from having lung cancer treatment.  Fecal occult blood test (FOBT) of stool. / Every year beginning at age 22 and continuing until age 52. You may not have to do this test if you get a colonoscopy every 10 years.  Flexible sigmoidoscopy** or colonoscopy.** / Every 5 years for a flexible  sigmoidoscopy or every 10 years for a colonoscopy beginning at age 67 and continuing until age 33.  Hepatitis C blood test.** / For all people born from [birth year] through 1965 and any individual with known risks for hepatitis C.  Abdominal aortic aneurysm (AAA) screening.** / A one-time screening for ages 58 to 4 years who are current or former smokers.  Skin self-exam. / Monthly.  Influenza vaccine. / Every year.  Tetanus, diphtheria, and acellular pertussis (Tdap/Td) vaccine.** / 1 dose of Td every 10 years.  Varicella vaccine.** / Consult your health care provider.  Zoster vaccine.** / 1 dose for adults aged 31 years or older.  Pneumococcal 13-valent conjugate (PCV13) vaccine.** / Consult your health care provider.  Pneumococcal polysaccharide (PPSV23) vaccine.** / 1 dose for all adults aged 51 years and older.  Meningococcal vaccine.** / Consult  your health care provider.  Hepatitis A vaccine.** / Consult your health care provider.  Hepatitis B vaccine.** / Consult your health care provider.  Haemophilus influenzae type b (Hib) vaccine.** / Consult your health care provider. **Family history and personal history of risk and conditions may change your health care provider's recommendations. Document Released: 12/31/2001 Document Revised: 11/09/2013 Document Reviewed: 04/01/2011 [facility] Patient Information 2015 [place], [place]. This information is not intended to replace advice given to you by your health care provider. Make sure you discuss any questions you have with your health care provider.

## 2015-02-01 NOTE — Progress Notes (Signed)
Subjective:    Patient ID: Mathew Clark, male    DOB: 06/04/1996, 19 y.o.   MRN: [id number]  HPI Mathew Clark, an 19 year old male presents accompanied by his mother to establish care. Mathew Clark was a patient of [facility] until recently. He states that he feels well and does not have complaints. His mother states that he his last complete physical examination was 1 year ago. He has regular dental visits, does not wear sunscreen, does not perform monthly self-testicular exams, and has never been sexually active. His maintains that he is up to date on vaccinations.   Patient denies fever, chest pain, shortness of breath, muscle aches, nausea, vomiting, or diarrhea.   History reviewed. No pertinent past medical history. History   Social History  . Marital Status: Single    Spouse Name: N/A  . Number of Children: N/A  . Years of Education: N/A   Occupational History  . Not on file.   Social History Main Topics  . Smoking status: Never Smoker   . Smokeless tobacco: Never Used  . Alcohol Use: No  . Drug Use: No  . Sexual Activity: No   Other Topics Concern  . Not on file   Social History Narrative  . No narrative on file  No Known Allergies Review of Systems  Constitutional: Negative.  Negative for fever and fatigue.  HENT: Negative.   Eyes: Negative.   Respiratory: Negative.  Negative for shortness of breath.   Cardiovascular: Negative.  Negative for chest pain.  Gastrointestinal: Negative.   Endocrine: Negative.  Negative for polydipsia, polyphagia and polyuria.  Genitourinary: Negative.   Musculoskeletal: Negative.   Skin: Negative.   Allergic/Immunologic: Negative.  Negative for food allergies.  Neurological: Negative.  Negative for light-headedness and headaches.  Hematological: Negative.   Psychiatric/Behavioral: Positive for decreased concentration (Has an IEP for a learning disorder). Negative for suicidal ideas, behavioral problems, confusion and sleep  disturbance. The patient is not nervous/anxious.        Objective:   Physical Exam  Constitutional: He is oriented to person, place, and time. He appears well-developed and well-nourished.  HENT:  Head: Normocephalic and atraumatic.  Right Ear: External ear normal.  Left Ear: External ear normal.  Nose: Nose normal.  Mouth/Throat: Oropharynx is clear and moist.  Eyes: Conjunctivae and EOM are normal. Pupils are equal, round, and reactive to light.  Neck: Normal range of motion. Neck supple.  Cardiovascular: Normal rate, regular rhythm, normal heart sounds and intact distal pulses.   Pulmonary/Chest: Effort normal and breath sounds normal.  Abdominal: Soft. Bowel sounds are normal. A hernia is present. Hernia confirmed positive in the left inguinal area. Hernia confirmed negative in the right inguinal area.  Genitourinary: Testes normal and penis normal. Cremasteric reflex is present. Right testis shows no mass, no swelling and no tenderness. Right testis is descended. Left testis shows no mass, no swelling and no tenderness. Left testis is descended. Circumcised.  Musculoskeletal: Normal range of motion.  Neurological: He is alert and oriented to person, place, and time. He has normal strength and normal reflexes. He displays a negative Romberg sign.  Reflex Scores:      Tricep reflexes are 2+ on the right side and 2+ on the left side.      Bicep reflexes are 2+ on the right side and 2+ on the left side.      Brachioradialis reflexes are 2+ on the right side and 2+ on the left side.  Patellar reflexes are 2+ on the right side and 2+ on the left side.      Achilles reflexes are 2+ on the right side and 2+ on the left side. Skin: Skin is warm and dry.  Psychiatric: He has a normal mood and affect. His behavior is normal. Judgment and thought content normal.      BP 116/60 mmHg  Pulse 53  Temp(Src) 98.2 F (36.8 C) (Oral)  Resp 16  Ht 5' 11.5" (1.816 m)  Wt 164 lb (74.39 kg)   BMI 22.56 kg/m2 Assessment & Plan:  1. Annual physical exam Recommend balanced diet. He currently eats fast food 2-3 times per week.  Increase water intake to 6-8 glasses per day He exercises daily, he is involved in community sports. He currently has a learning disorder. Reviewed IEP forms. Specific learning disorder is not documented.  Will review immunizations as they come available Recommend a daily multivitamin -Discussed the importance of refraining from tobacco use - CBC with Differential - COMPLETE METABOLIC PANEL WITH GFR - Lipid Panel - TSH - Urinalysis, Complete    RTC: 1 year for CPE with Dr. Ashley Royalty or as needed.    Massie Maroon, FNP

## 2015-02-02 LAB — LIPID PANEL
Cholesterol: 122 mg/dL (ref 0–169)
HDL: 48 mg/dL (ref 31–65)
LDL Cholesterol: 53 mg/dL (ref 0–109)
Total CHOL/HDL Ratio: 2.5 Ratio
Triglycerides: 107 mg/dL (ref <150)
VLDL: 21 mg/dL (ref 0–40)

## 2015-02-02 LAB — CBC WITH DIFFERENTIAL/PLATELET
Basophils Absolute: 0 10*3/uL (ref 0.0–0.1)
Basophils Relative: 1 % (ref 0–1)
Eosinophils Absolute: 0.1 10*3/uL (ref 0.0–0.7)
Eosinophils Relative: 5 % (ref 0–5)
HCT: 44.5 % (ref 39.0–52.0)
Hemoglobin: 15.1 g/dL (ref 13.0–17.0)
Lymphocytes Relative: 62 % — ABNORMAL HIGH (ref 12–46)
Lymphs Abs: 1.7 10*3/uL (ref 0.7–4.0)
MCH: 28 pg (ref 26.0–34.0)
MCHC: 33.9 g/dL (ref 30.0–36.0)
MCV: 82.6 fL (ref 78.0–100.0)
MPV: 9.2 fL (ref 8.6–12.4)
Monocytes Absolute: 0.3 10*3/uL (ref 0.1–1.0)
Monocytes Relative: 9 % (ref 3–12)
Neutro Abs: 0.6 10*3/uL — ABNORMAL LOW (ref 1.7–7.7)
Neutrophils Relative %: 23 % — ABNORMAL LOW (ref 43–77)
Platelets: 200 10*3/uL (ref 150–400)
RBC: 5.39 MIL/uL (ref 4.22–5.81)
RDW: 14 % (ref 11.5–15.5)
WBC: 2.8 10*3/uL — ABNORMAL LOW (ref 4.0–10.5)

## 2015-02-02 LAB — URINALYSIS, COMPLETE
BACTERIA UA: NONE SEEN
Bilirubin Urine: NEGATIVE
Casts: NONE SEEN
Crystals: NONE SEEN
Glucose, UA: NEGATIVE mg/dL
Hgb urine dipstick: NEGATIVE
Ketones, ur: NEGATIVE mg/dL
Leukocytes, UA: NEGATIVE
Nitrite: NEGATIVE
PH: 8 (ref 5.0–8.0)
PROTEIN: 30 mg/dL — AB
SPECIFIC GRAVITY, URINE: 1.017 (ref 1.005–1.030)
Squamous Epithelial / LPF: NONE SEEN
UROBILINOGEN UA: 1 mg/dL (ref 0.0–1.0)

## 2015-02-02 LAB — COMPLETE METABOLIC PANEL WITH GFR
ALT: 13 U/L (ref 0–53)
AST: 21 U/L (ref 0–37)
Albumin: 4.1 g/dL (ref 3.5–5.2)
Alkaline Phosphatase: 72 U/L (ref 39–117)
BUN: 6 mg/dL (ref 6–23)
CO2: 23 mEq/L (ref 19–32)
Calcium: 9.2 mg/dL (ref 8.4–10.5)
Chloride: 104 mEq/L (ref 96–112)
Creat: 0.78 mg/dL (ref 0.50–1.35)
GFR, Est African American: >89 mL/min
GFR, Est Non African American: >89 mL/min
Glucose, Bld: 72 mg/dL (ref 70–99)
Potassium: 4.3 mEq/L (ref 3.5–5.3)
Sodium: 138 mEq/L (ref 135–145)
Total Bilirubin: 1.4 mg/dL — ABNORMAL HIGH (ref 0.2–1.1)
Total Protein: 6.9 g/dL (ref 6.0–8.3)

## 2015-02-02 LAB — TSH: TSH: 1.227 u[IU]/mL (ref 0.350–4.500)

## 2015-02-03 ENCOUNTER — Encounter: Payer: Self-pay | Admitting: Family Medicine

## 2015-11-15 ENCOUNTER — Emergency Department (HOSPITAL_COMMUNITY)
Admission: EM | Admit: 2015-11-15 | Discharge: 2015-11-16 | Disposition: A | Discharge status: Home / Self Care | Payer: No Typology Code available for payment source | Attending: Physician Assistant | Admitting: Physician Assistant

## 2015-11-15 ENCOUNTER — Encounter (HOSPITAL_COMMUNITY): Payer: Self-pay | Admitting: Emergency Medicine

## 2015-11-15 DIAGNOSIS — Y9389 Activity, other specified: Secondary | ICD-10-CM | POA: Diagnosis not present

## 2015-11-15 DIAGNOSIS — Y92410 Unspecified street and highway as the place of occurrence of the external cause: Secondary | ICD-10-CM | POA: Insufficient documentation

## 2015-11-15 DIAGNOSIS — S060X0A Concussion without loss of consciousness, initial encounter: Secondary | ICD-10-CM | POA: Diagnosis not present

## 2015-11-15 DIAGNOSIS — S0990XA Unspecified injury of head, initial encounter: Secondary | ICD-10-CM | POA: Diagnosis present

## 2015-11-15 DIAGNOSIS — Y998 Other external cause status: Secondary | ICD-10-CM | POA: Diagnosis not present

## 2015-11-15 MED ORDER — ACETAMINOPHEN 500 MG PO TABS
1000.0000 mg | ORAL_TABLET | Freq: Once | ORAL | Status: AC
  Administered 2015-11-16: 1000 mg via ORAL
  Filled 2015-11-15: qty 2

## 2015-11-15 NOTE — Discharge Instructions (Signed)
Do not participate in any sports or any activities that could result in head trauma until you are cleared by your pediatrician,  primary care physician or neurologist.   Do not hesitate to return to the emergency room for any new, worsening or concerning symptoms.  Please obtain primary care using resource guide below. Let them know that you were seen in the emergency room and that they will need to obtain records for further outpatient management.    Post-Concussion Syndrome Post-concussion syndrome is the symptoms that can occur after a head injury. These symptoms can last from weeks to months. HOME CARE   Take medicines only as told by your doctor.  Do not take aspirin.  Sleep with your head raised to help with headaches.  Avoid activities that can cause another head injury.  Do not play contact sports like football, hockey, soccer, or basketball.  Do not do other risky activities like downhill skiing, martial arts, or horseback riding until your doctor says it is okay.  Keep all follow-up visits as told by your doctor. This is important. GET HELP IF:   You have a harder time:  Paying attention.  Focusing.  Remembering.  Learning new information.  Dealing with stress.  You need more time to complete tasks.  You are easily bothered (irritable).  You have more symptoms. Get help if you have any of these symptoms for more than two weeks after your injury:   Long-lasting (chronic) headaches.  Dizziness.  Trouble balancing.  Feeling sick to your stomach (nauseous).  Trouble with your vision.  Noise or light bothers you more.  Depression.  Mood swings.  Feeling worried (anxious).  Easily bothered.  Memory problems.  Trouble concentrating or paying attention.  Sleep problems.  Feeling tired all of the time. GET HELP RIGHT AWAY IF:  You feel confused.  You feel very sleepy.  You are hard to wake up.  You feel sick to your stomach.  You keep  throwing up (vomiting).  You feel like you are moving when you are not (vertigo).  Your eyes move back and forth very quickly.  You start shaking (convulsing) or pass out (faint).  You have very bad headaches that do not get better with medicine.  You cannot use your arms or legs like normal.  One of the black centers of your eyes (pupils) is bigger than the other.  You have clear or bloody fluid coming from your nose or ears.  Your problems get worse, not better. MAKE SURE YOU:  Understand these instructions.  Will watch your condition.  Will get help right away if you are not doing well or get worse.   This information is not intended to replace advice given to you by your health care provider. Make sure you discuss any questions you have with your health care provider.   Document Released: 12/12/2004 Document Revised: 11/25/2014 Document Reviewed: 02/09/2014 Elsevier Interactive Patient Education 2016 [employer]  Emergency Department Resource Guide 1) Find a Doctor and Pay Out of Pocket Although you won't have to find out who is covered by your insurance plan, it is a good idea to ask around and get recommendations. You will then need to call the office and see if the doctor you have chosen will accept you as a new patient and what types of options they offer for patients who are self-pay. Some doctors offer discounts or will set up payment plans for their patients who do not have insurance, but you  will need to ask so you aren't surprised when you get to your appointment. ° °2) Contact Your Local Health Department °Not all health departments have doctors that can see patients for sick visits, but many do, so it is worth a call to see if yours does. If you don't know where your local health department is, you can check in your phone book. The CDC also has a tool to help you locate your state's health department, and many state websites also have listings of all of their local  health departments. ° °3) Find a Walk-in Clinic °If your illness is not likely to be very severe or complicated, you may want to try a walk in clinic. These are popping up all over the country in pharmacies, drugstores, and shopping centers. They're usually staffed by nurse practitioners or physician assistants that have been trained to treat common illnesses and complaints. They're usually fairly quick and inexpensive. However, if you have serious medical issues or chronic medical problems, these are probably not your best option. ° °No Primary Care Doctor: °- Call Health Connect at  832-8000 - they can help you locate a primary care doctor that  accepts your insurance, provides certain services, etc. °- Physician Referral Service- 1-800-533-3463 ° °Chronic Pain Problems: °Organization         Address  Phone   Notes  ° [facility]  (336) 297-2271 Patients need to be referred by their primary care doctor.  ° °Medication Assistance: °Organization         Address  Phone   Notes  °[place] [facility] [address] °[place], [place] [address] (336) 641-8030 --Must be a resident of [place] °-- Must have NO insurance coverage whatsoever (no Medicaid/ Medicare, etc.) °-- The pt. MUST have a primary care doctor that directs their care regularly and follows them in the community °  °[facility]  (866) 331-1348   °[employer]  (888) 892-1162   ° °Agencies that provide inexpensive medical care: °Organization         Address  Phone   Notes  °[facility]  (336) 832-8035   °[facility]    (336) 832-7272   °[facility] [address] °[place], [place] [address] (336) 832-4777   °[facility] [address], °[place] (336) 271-4999   °[facility]    (336) 373-0678   °[facility]    (336) 272-1050   °[facility] ° [address], [place] Phone:   (336) 832-4444, Fax:  (336) 832-4440 Hours of Operation:  9 am - 6 pm, M-F.  Also accepts Medicaid/Medicare and self-pay.  °[facility] ° [address], [place] Phone: (336) 832-3150, Fax: (336) [phone]. Hours of Operation:  8:30 am - 5:30 pm, M-F.  Also accepts Medicaid and self-pay.  °[facility] [place] [address], [place] Phone: (336) 878-6027   °[facility] [address], [place], [place] (336)723-1848, Ext. 123 Mondays & Thursdays: 7-9 AM.  First 15 patients are seen on a first come, first serve basis. °  ° °Medicaid-accepting [place] Providers: ° °Organization         Address  Phone   Notes  °[facility] [address], [place] (336) 641-2100 Also accepts self-pay patients.  °[facility] [address],   [place] ° (336) 856-9996   °[facility] [address], [place] (336) 288-8857   °[facility] [address] [place] [address], [place] (336) 299-7000   °Veita Bland [address], [place]  ° (336) 373-1557 Only accepts [place] Access Medicaid patients after they have their name applied to their card.  ° °Self-Pay (no insurance) in [place]: ° °Organization         Address  Phone   Notes  °Sickle Cell Patients, [facility] [address], [place] (336) 832-1970   °[facility] [address], [place] (336) 832-4400   °[facility] ° [address], [place] (336) 992-4800   °[facility]/Dr. Osei-Bonsu ° [address] [place] [address], [place] or [address], [place] (336) 841-8500 Phone number for both [place] and [place] locations is the same.  °[facility] [address], [place] (336) 299-0000   °[facility] [address] [place] [address], [place] or [address] (336)  852-7530 °(336) 878-2260   °[facility] [address], [place] (336) 350-1642, phone; (336) 294-5005, fax Sees patients 1st and 3rd Saturday of every month.  Must not qualify for public or private insurance (i.e. Medicaid, Medicare, [place] Health Choice, Veterans' Benefits) • Household income should be no more than 200% of the poverty level •The clinic cannot treat you if you are pregnant or think you are pregnant • Sexually transmitted diseases are not treated at the clinic.  ° ° °Dental Care: °Organization         Address  Phone  Notes  °[facility] [address], [place] (336) 641-6152 Accepts children up to age 21 who are enrolled in Medicaid or [place] Health Choice; pregnant women with a Medicaid card; and children who have applied for Medicaid or [place] Health Choice, but were declined, whose parents can pay a reduced fee at time of service.  °[facility] [place]  [address], [place] (336) 641-7733 Accepts children up to age 21 who are enrolled in Medicaid or [place] Health Choice; pregnant women with a Medicaid card; and children who have applied for Medicaid or [place] Health Choice, but were declined, whose parents can pay a reduced fee at time of service.  °[facility] ° [address], [place] (336) 641-4533 Patients are seen by appointment only. Walk-ins are not accepted. [facility] will see patients 18 years of age and older. °Monday - Tuesday (8am-5pm) °Most Wednesdays (8:30-5pm) °$30 per visit, cash only  °[facility] ° [address], [place] (336) 641-4533 Patients are seen by appointment only. Walk-ins are not accepted. [facility] will see patients 18 years of age and older. °One Wednesday Evening (Monthly: Volunteer Based).  $30 per visit, cash only  °[facility]  (919) 537-3737 for adults;  Children under age 4, call Graduate Pediatric Dentistry at ([phone]) [phone]. Children aged 4-14, please call ([phone]) [phone] to request a pediatric application. ° Dental services are provided in all areas of dental care including fillings, crowns and bridges, complete and partial dentures, implants, gum treatment, root canals, and extractions. Preventive care is also provided. Treatment is provided to both adults and children. °Patients are selected via a lottery and   there is often a waiting list. °  °[facility] [address], °[place] ° (336) 763-8833 [email or web address] °  °[facility] [address], [place], [place] (336)723-1848, Ext. 123 Second and Fourth Thursday of each month, opens at 6:30 AM; Clinic ends at 9 AM.  Patients are seen on a first-come first-served basis, and a limited number are seen during each clinic.  ° °[facility] ° [address], [place], [place] (336) 723-7904   Eligibility Requirements °You must have lived in [place], [place], or [place] counties for at least the last three months. °  You cannot be eligible for state or federal sponsored healthcare insurance, including Veterans Administration, Medicaid, or Medicare. °  You generally cannot be eligible for healthcare insurance through your employer.  °  How to apply: °Eligibility screenings are held every Tuesday and Wednesday afternoon from 1:00 pm until 4:00 pm. You do not need an appointment for the interview!  °[facility] [address], [place], [place] 336-631-2330   °[facility]  336-342-8273   °[facility]  336-703-3100   °[facility]  336-570-6415   ° °Behavioral Health Resources in the Community: °Intensive Outpatient Programs °Organization         Address  Phone  Notes  °[facility] [address], [place], [place] 336-878-6098   °[facility] [address], [place], [place] 336-832-9800   °[facility] [address], [place], [place] ° 336-882-2125   °[facility] [address],  °[place], [place] 1-800-853-5163 or 336-641-4981   °Substance Abuse Resources °Organization         Address  Phone  Notes  °[facility]  336-882-2125   °[facility]  336-784-9470   °[facility]  336-285-9073   °[facility]  336-845-3988   °Residential & Outpatient Substance Abuse Program  1-800-659-3381   °Psychological Services °Organization         Address  Phone  Notes  °[facility]  336- 832-9600   °[facility]  336- 378-7881   °[facility] [address], [place] 1-800-853-5163 or 336-641-4981   ° °Mobile Crisis Teams °Organization         Address  Phone  Notes  °[facility], Mobile Crisis Care Unit  1-877-626-1772   °Assertive °[facility] ° [address] [place], [place] 336-834-9664   °Sharon DeEsch [address] °[place] 336-554-5454   ° °Self-Help/Support Groups °Organization         Address  Phone             Notes  °[facility] - variety of support groups  336- 373-1402 Call for more information  °Narcotics Anonymous (NA), [facility] [address], °[place]  2 meetings at this location  ° °Residential Treatment Programs °Organization         Address  Phone  Notes  °[facility] [address],    °[place]  1-866-801-8205   °[facility] ° [address], [place], [place] 704-293-8524   °[facility] [address], [place] 336-845-3988 Admissions: 8am-3pm M-F  °[facility] [address]    °[place], [place] 336-841-1104   °[facility] [address], [place], [place] 336-379-7146   °  [facility] [address],  [place], [place] 409-811-9147   [facility] - Intensive  Outpatient [address], [place], [place] 829-562-1308   [facility] ([facility] [address] [place]  [place], [place] 6-578-469-6295 or [phone]   [facility] [address] [place], [place] 027-253-6644 Accepts Medicaid  [facility] [address]  [place] 0-347-425-9563 Substance Abuse/Addiction Treatment   [facility]         Address  Phone  Notes  [facility]  (435) 846-1072   Angie Fava, PhD [address] [place], [place]   (212)242-2246 or 318-544-3149   [facility]   [address] [place], [place] [phone]   [facility] [address] [place], [place] (908) 820-8112 Insurance/Medicaid/sponsorship through [facility] and Families [address]                                    [place], [place] 386-327-4846 Therapy/tele-psych/case  [facility] [address], [place] 908-533-1714    Dr. Lolly Mustache  (978) 014-0121   [facility]  [employer] [facility] Dept. 1) [address] [place] 2) [address], [place] 3)  [address], [place] [phone]  ([phone])[phone]   [facility] Child Abuse Hotline 440 873 6938 or 425-169-2101 (After Hours)
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
## 2016-02-01 ENCOUNTER — Encounter: Payer: Medicaid Other | Admitting: Internal Medicine

## 2016-02-27 ENCOUNTER — Ambulatory Visit: Payer: Medicaid Other | Admitting: Family Medicine

## 2018-09-25 ENCOUNTER — Encounter (HOSPITAL_COMMUNITY): Payer: Self-pay

## 2018-09-25 ENCOUNTER — Other Ambulatory Visit: Payer: Self-pay

## 2018-09-25 ENCOUNTER — Emergency Department (HOSPITAL_COMMUNITY)
Admission: EM | Admit: 2018-09-25 | Discharge: 2018-09-25 | Disposition: A | Discharge status: Home / Self Care | Payer: Self-pay | Attending: Emergency Medicine | Admitting: Emergency Medicine

## 2018-09-25 DIAGNOSIS — Y9389 Activity, other specified: Secondary | ICD-10-CM | POA: Insufficient documentation

## 2018-09-25 DIAGNOSIS — Z23 Encounter for immunization: Secondary | ICD-10-CM | POA: Insufficient documentation

## 2018-09-25 DIAGNOSIS — S61411A Laceration without foreign body of right hand, initial encounter: Secondary | ICD-10-CM | POA: Insufficient documentation

## 2018-09-25 DIAGNOSIS — Y929 Unspecified place or not applicable: Secondary | ICD-10-CM | POA: Insufficient documentation

## 2018-09-25 DIAGNOSIS — W260XXA Contact with knife, initial encounter: Secondary | ICD-10-CM | POA: Insufficient documentation

## 2018-09-25 DIAGNOSIS — Y999 Unspecified external cause status: Secondary | ICD-10-CM | POA: Insufficient documentation

## 2018-09-25 MED ORDER — LIDOCAINE-EPINEPHRINE (PF) 2 %-1:200000 IJ SOLN
10.0000 mL | Freq: Once | INTRAMUSCULAR | Status: AC
  Administered 2018-09-25: 16:00:00 via INTRADERMAL

## 2018-09-25 MED ORDER — TETANUS-DIPHTH-ACELL PERTUSSIS 5-2.5-18.5 LF-MCG/0.5 IM SUSP
0.5000 mL | Freq: Once | INTRAMUSCULAR | Status: AC
  Administered 2018-09-25: 0.5 mL via INTRAMUSCULAR
  Filled 2018-09-25: qty 0.5

## 2018-09-25 MED ORDER — LIDOCAINE-EPINEPHRINE (PF) 2 %-1:200000 IJ SOLN
INTRAMUSCULAR | Status: AC
  Filled 2018-09-25: qty 20
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
## 2018-09-25 NOTE — Discharge Instructions (Addendum)
I have placed 5 stitches in your hand. You will need to return to your primary care or urgent care in 5-7 days to have them removed. Keep the current dressing on for 24 hours. After that, you may wash with normal soap and water once or twice daily. I recommend that you keep the area covered with gauze.  Follow-up with a medical provider if you have one or more of the following symptoms: fever; increased redness, warmth or tenderness at the wound site; pain in joints beyond where the initial wound was; unusual discharge.  Thank you for allowing [place] to take care of you today. Have a good weekend!
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
## 2018-09-25 NOTE — ED Notes (Signed)
Xeroform and guaze dressing placed to sutured wound. Pt tolerated well.

## 2018-10-02 ENCOUNTER — Encounter (HOSPITAL_COMMUNITY): Payer: Self-pay | Admitting: Emergency Medicine

## 2018-10-02 ENCOUNTER — Other Ambulatory Visit: Payer: Self-pay

## 2018-10-02 ENCOUNTER — Emergency Department (HOSPITAL_COMMUNITY)
Admission: EM | Admit: 2018-10-02 | Discharge: 2018-10-02 | Disposition: A | Discharge status: Home / Self Care | Payer: Self-pay | Attending: Emergency Medicine | Admitting: Emergency Medicine

## 2018-10-02 DIAGNOSIS — W260XXD Contact with knife, subsequent encounter: Secondary | ICD-10-CM | POA: Insufficient documentation

## 2018-10-02 DIAGNOSIS — S61411D Laceration without foreign body of right hand, subsequent encounter: Secondary | ICD-10-CM | POA: Insufficient documentation

## 2018-10-02 DIAGNOSIS — Y929 Unspecified place or not applicable: Secondary | ICD-10-CM | POA: Insufficient documentation

## 2018-10-02 DIAGNOSIS — Y998 Other external cause status: Secondary | ICD-10-CM | POA: Insufficient documentation

## 2018-10-02 DIAGNOSIS — S0081XA Abrasion of other part of head, initial encounter: Secondary | ICD-10-CM | POA: Insufficient documentation

## 2018-10-02 DIAGNOSIS — Y9389 Activity, other specified: Secondary | ICD-10-CM | POA: Insufficient documentation

## 2018-10-02 MED ORDER — BACITRACIN ZINC 500 UNIT/GM EX OINT
TOPICAL_OINTMENT | Freq: Once | CUTANEOUS | Status: AC
  Administered 2018-10-02: 1 via TOPICAL
  Filled 2018-10-02: qty 0.9
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
## 2018-10-02 NOTE — Discharge Instructions (Signed)
It was my pleasure taking care of you today!   Please follow up with your primary care doctor, an urgent care or return to ER for suture removal on the 17th.   In the meantime, please keep the wound clean and dry.   Return to ER for new or worsening symptoms, any additional concerns.
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
## 2019-01-20 ENCOUNTER — Other Ambulatory Visit: Payer: Self-pay

## 2019-01-20 DIAGNOSIS — Y9367 Activity, basketball: Secondary | ICD-10-CM | POA: Insufficient documentation

## 2019-01-20 DIAGNOSIS — Y999 Unspecified external cause status: Secondary | ICD-10-CM | POA: Insufficient documentation

## 2019-01-20 DIAGNOSIS — S39012A Strain of muscle, fascia and tendon of lower back, initial encounter: Secondary | ICD-10-CM | POA: Insufficient documentation

## 2019-01-20 DIAGNOSIS — Y929 Unspecified place or not applicable: Secondary | ICD-10-CM | POA: Insufficient documentation

## 2019-01-20 DIAGNOSIS — X58XXXA Exposure to other specified factors, initial encounter: Secondary | ICD-10-CM | POA: Insufficient documentation

## 2019-01-21 ENCOUNTER — Encounter (HOSPITAL_COMMUNITY): Payer: Self-pay | Admitting: Emergency Medicine

## 2019-01-21 ENCOUNTER — Emergency Department (HOSPITAL_COMMUNITY)
Admission: EM | Admit: 2019-01-21 | Discharge: 2019-01-21 | Disposition: A | Discharge status: Home / Self Care | Payer: Self-pay | Attending: Emergency Medicine | Admitting: Emergency Medicine

## 2019-01-21 ENCOUNTER — Other Ambulatory Visit: Payer: Self-pay

## 2019-01-21 DIAGNOSIS — S39012A Strain of muscle, fascia and tendon of lower back, initial encounter: Secondary | ICD-10-CM

## 2019-01-21 MED ORDER — IBUPROFEN 200 MG PO TABS
600.0000 mg | ORAL_TABLET | Freq: Once | ORAL | Status: AC
  Administered 2019-01-21: 600 mg via ORAL

## 2019-01-21 MED ORDER — CYCLOBENZAPRINE HCL 10 MG PO TABS
ORAL_TABLET | ORAL | Status: AC
  Administered 2019-01-21: 01:00:00
  Filled 2019-01-21: qty 1

## 2019-01-21 MED ORDER — IBUPROFEN 600 MG PO TABS: 600.0000 mg | ORAL_TABLET | Freq: Four times a day (QID) | ORAL | 0 refills | Status: DC | PRN

## 2019-01-21 MED ORDER — CYCLOBENZAPRINE HCL 10 MG PO TABS: 10.0000 mg | ORAL_TABLET | Freq: Two times a day (BID) | ORAL | 0 refills | Status: AC | PRN

## 2019-01-21 MED ORDER — CYCLOBENZAPRINE HCL 10 MG PO TABS
10.0000 mg | ORAL_TABLET | Freq: Once | ORAL | Status: AC
  Administered 2019-01-21: 10 mg via ORAL

## 2019-01-21 MED ORDER — IBUPROFEN 200 MG PO TABS
ORAL_TABLET | ORAL | Status: AC
  Administered 2019-01-21: 01:00:00
  Filled 2019-01-21: qty 3

## 2019-01-21 NOTE — ED Provider Notes (Signed)
[facility]-EMERGENCY DEPT Provider Note   CSN: [id number] Arrival date & time: 01/20/19  2353    History   Chief Complaint Chief Complaint  Patient presents with  . Back Pain    HPI Mathew Clark is a 23 y.o. male with no pertinent past medical history who presents to the emergency department with a chief complaint of left-sided low back pain.  The patient reports the pain began suddenly while he was playing a game of basketball.  He denies falling.  He is unable to characterize the pain.  Pain is worse with twisting of the torso.  No other known aggravating or alleviating factors.  No treatment prior to arrival.  He denies urinary symptoms, fever, chills, numbness, weakness, penile or testicular pain or swelling, abdominal pain, nausea, vomiting, diarrhea, constipation.  No previous low back injuries or surgeries.     The history is provided by the patient. No language interpreter was used.    History reviewed. No pertinent past medical history.  There are no active problems to display for this patient.   History reviewed. No pertinent surgical history.      Home Medications    Prior to Admission medications   Medication Sig Start Date End Date Taking? Authorizing Provider  cyclobenzaprine (FLEXERIL) 10 MG tablet Take 1 tablet (10 mg total) by mouth 2 (two) times daily as needed for muscle spasms. 01/21/19   McDonald, Mia A, PA-C  ibuprofen (ADVIL,MOTRIN) 600 MG tablet Take 1 tablet (600 mg total) by mouth every 6 (six) hours as needed. 01/21/19   McDonald, Coral Else, PA-C    Family History History reviewed. No pertinent family history.  Social History Social History   Tobacco Use  . Smoking status: Never Smoker  . Smokeless tobacco: Never Used  Substance Use Topics  . Alcohol use: No  . Drug use: No     Allergies   Patient has no known allergies.   Review of Systems Review of Systems  Constitutional: Negative for appetite change, chills  and fever.  Respiratory: Negative for shortness of breath and wheezing.   Cardiovascular: Negative for chest pain, palpitations and leg swelling.  Gastrointestinal: Negative for abdominal pain, diarrhea, nausea and vomiting.  Genitourinary: Negative for discharge, dysuria, frequency, penile pain and urgency.  Musculoskeletal: Positive for back pain and myalgias. Negative for arthralgias, gait problem, joint swelling, neck pain and neck stiffness.  Skin: Negative for rash.  Allergic/Immunologic: Negative for immunocompromised state.  Neurological: Negative for weakness, numbness and headaches.  Psychiatric/Behavioral: Negative for confusion.     Physical Exam Updated Vital Signs BP 120/80 (BP Location: Left Arm)   Pulse 79   Resp 15   Ht 5\' 11"  (1.803 m)   Wt 77.1 kg   SpO2 100%   BMI 23.71 kg/m   Physical Exam Vitals signs and nursing note reviewed.  Constitutional:      Appearance: He is well-developed.  HENT:     Head: Normocephalic.  Eyes:     General: No scleral icterus.    Conjunctiva/sclera: Conjunctivae normal.  Neck:     Musculoskeletal: Neck supple.  Cardiovascular:     Rate and Rhythm: Normal rate and regular rhythm.     Pulses: Normal pulses.     Heart sounds: Normal heart sounds. No murmur. No friction rub. No gallop.   Pulmonary:     Effort: Pulmonary effort is normal. No respiratory distress.     Breath sounds: No stridor. No wheezing, rhonchi or rales.  Chest:     Chest wall: No tenderness.  Abdominal:     General: There is no distension.     Palpations: Abdomen is soft. There is no mass.     Tenderness: There is no abdominal tenderness. There is no right CVA tenderness, left CVA tenderness, guarding or rebound.     Hernia: No hernia is present.  Musculoskeletal:        General: Tenderness present.     Comments: TTP over the left lumbar muscles.  No midline tenderness to the cervical, thoracic, or lumbar spine.  No right-sided tenderness.  5-5  strength against resistance with dorsiflexion plantarflexion.  Ambulatory without difficulty.  Sensation is intact and equal throughout.  DP and PT pulses are 2+ and symmetric.  Skin:    General: Skin is warm and dry.  Neurological:     Mental Status: He is alert.  Psychiatric:        Behavior: Behavior normal.      ED Treatments / Results  Labs (all labs ordered are listed, but only abnormal results are displayed) Labs Reviewed - No data to display  EKG None  Radiology No results found.  Procedures Procedures (including critical care time)  Medications Ordered in ED Medications  ibuprofen (ADVIL,MOTRIN) tablet 600 mg (600 mg Oral Given 01/21/19 0112)  cyclobenzaprine (FLEXERIL) tablet 10 mg (10 mg Oral Given 01/21/19 0112)  cyclobenzaprine (FLEXERIL) 10 MG tablet (  Given 01/21/19 0114)  ibuprofen (ADVIL,MOTRIN) 200 MG tablet (  Given 01/21/19 0114)     Initial Impression / Assessment and Plan / ED Course  I have reviewed the triage vital signs and the nursing notes.  Pertinent labs & imaging results that were available during my care of the patient were reviewed by me and considered in my medical decision making (see chart for details).        23 year old male presenting with left-sided low back pain that began after a basketball game earlier tonight.  Pain is worse with twisting of the torso.  He is neurologically intact.  He has no urinary symptoms or abdominal pain.  Ibuprofen and Flexeril given in the ER. No concern for cauda equina, infection, AAA, infection, or fracture.  No constitutional symptoms including fever, chills, or weight loss,  No h/o cancer, IVDU.  Will discharge to home with RICE protocol and anti-inflammatory medicine. Discussed ED return precaution and indications for PCP follow up. The patient acknowledges the plan and is agreeable at this time.    Final Clinical Impressions(s) / ED Diagnoses   Final diagnoses:  Acute myofascial strain of lumbar  region, initial encounter    ED Discharge Orders         Ordered    cyclobenzaprine (FLEXERIL) 10 MG tablet  2 times daily PRN     01/21/19 0109    ibuprofen (ADVIL,MOTRIN) 600 MG tablet  Every 6 hours PRN     01/21/19 0109           McDonald, Pedro Earls A, PA-C 01/21/19 0086    Gilda Crease, MD 01/21/19 [phone]

## 2019-01-21 NOTE — ED Triage Notes (Signed)
Patient complaining of lower back pain. Patient states he did not injure hisself. Patient states the pain started today.

## 2019-01-21 NOTE — Discharge Instructions (Addendum)
Thank you for allowing me to care for you today in the Emergency Department.   Take 600 mg of ibuprofen with food every 6 hours or 650 mg of Tylenol.  You may take 1 tablet of Flexeril by mouth up to 2 times daily.  Do not work or drive while taking this medication because it may make you drowsy.  Do not take other medications or substances that may also make you drowsy while you are taking this medication.  Apply an ice pack for 15 to 20 minutes as frequently as needed.  Start to stretch the muscles of your low back as your pain allows.  Use caution with returning to activities such as basketball until your pain improves as you may cause your injury to worsen.  Call the number on your discharge paperwork to get established with a primary care provider for follow-up if your symptoms do not start to improve with this regimen in the next week.  Return to the emergency department if you develop new numbness or weakness, high fever, blood in your urine, severe abdominal pain, or other new, concerning symptoms.

## 2019-06-06 ENCOUNTER — Encounter (HOSPITAL_COMMUNITY): Payer: Self-pay | Admitting: Emergency Medicine

## 2019-06-06 ENCOUNTER — Emergency Department (HOSPITAL_COMMUNITY): Payer: Self-pay

## 2019-06-06 ENCOUNTER — Other Ambulatory Visit: Payer: Self-pay

## 2019-06-06 DIAGNOSIS — F17200 Nicotine dependence, unspecified, uncomplicated: Secondary | ICD-10-CM | POA: Insufficient documentation

## 2019-06-06 DIAGNOSIS — R0789 Other chest pain: Secondary | ICD-10-CM | POA: Insufficient documentation

## 2019-06-06 MED ORDER — SODIUM CHLORIDE 0.9% FLUSH: 3.0000 mL | Freq: Once | INTRAVENOUS | Status: DC

## 2019-06-06 NOTE — ED Triage Notes (Signed)
Pt reports sudden onset of left chest pain without radiation appx 7min ago.

## 2019-06-07 ENCOUNTER — Encounter (HOSPITAL_COMMUNITY): Payer: Self-pay | Admitting: Emergency Medicine

## 2019-06-07 ENCOUNTER — Emergency Department (HOSPITAL_COMMUNITY)
Admission: EM | Admit: 2019-06-07 | Discharge: 2019-06-07 | Disposition: A | Discharge status: Home / Self Care | Payer: Self-pay | Attending: Emergency Medicine | Admitting: Emergency Medicine

## 2019-06-07 DIAGNOSIS — R0789 Other chest pain: Secondary | ICD-10-CM

## 2019-06-07 LAB — BASIC METABOLIC PANEL
Anion gap: 11 (ref 5–15)
BUN: 9 mg/dL (ref 6–20)
CO2: 25 mmol/L (ref 22–32)
Calcium: 9.2 mg/dL (ref 8.9–10.3)
Chloride: 101 mmol/L (ref 98–111)
Creatinine, Ser: 0.87 mg/dL (ref 0.61–1.24)
GFR calc Af Amer: >60 mL/min (ref >60)
GFR calc non Af Amer: >60 mL/min (ref >60)
Glucose, Bld: 101 mg/dL — ABNORMAL HIGH (ref 70–99)
Potassium: 3.8 mmol/L (ref 3.5–5.1)
Sodium: 137 mmol/L (ref 135–145)

## 2019-06-07 LAB — CBC
HCT: 47.2 % (ref 39.0–52.0)
Hemoglobin: 15.8 g/dL (ref 13.0–17.0)
MCH: 28.5 pg (ref 26.0–34.0)
MCHC: 33.5 g/dL (ref 30.0–36.0)
MCV: 85 fL (ref 80.0–100.0)
Platelets: 220 10*3/uL (ref 150–400)
RBC: 5.55 MIL/uL (ref 4.22–5.81)
RDW: 12.3 % (ref 11.5–15.5)
WBC: 5 10*3/uL (ref 4.0–10.5)
nRBC: 0 % (ref 0.0–0.2)

## 2019-06-07 LAB — TROPONIN I (HIGH SENSITIVITY): Troponin I (High Sensitivity): <2 ng/L (ref <18)

## 2019-06-07 MED ORDER — ACETAMINOPHEN 500 MG PO TABS
1000.0000 mg | ORAL_TABLET | Freq: Once | ORAL | Status: AC
  Administered 2019-06-07: 1000 mg via ORAL
  Filled 2019-06-07: qty 2

## 2019-06-07 MED ORDER — ALUM & MAG HYDROXIDE-SIMETH 200-200-20 MG/5ML PO SUSP
30.0000 mL | Freq: Once | ORAL | Status: AC
  Administered 2019-06-07: 30 mL via ORAL
  Filled 2019-06-07: qty 30

## 2019-06-07 MED ORDER — LIDOCAINE VISCOUS HCL 2 % MT SOLN
15.0000 mL | Freq: Once | OROMUCOSAL | Status: AC
  Administered 2019-06-07: 15 mL via ORAL
  Filled 2019-06-07: qty 15

## 2019-06-07 MED ORDER — IBUPROFEN 800 MG PO TABS
800.0000 mg | ORAL_TABLET | Freq: Once | ORAL | Status: AC
  Administered 2019-06-07: 800 mg via ORAL
  Filled 2019-06-07: qty 1

## 2019-06-07 NOTE — ED Provider Notes (Signed)
[facility] DEPT Provider Note   CSN: [id number] Arrival date & time: 06/06/19  2334     History   Chief Complaint Chief Complaint  Patient presents with  . Chest Pain    HPI Mathew Clark is a 23 y.o. male.     The history is provided by the patient.  Chest Pain Pain location:  L chest Pain quality: sharp   Pain radiates to:  Does not radiate Pain severity:  Moderate Onset quality:  Sudden Timing:  Constant Progression:  Resolved Chronicity:  New Context: at rest   Context: not breathing, not drug use and not lifting   Relieved by:  Nothing Worsened by:  Nothing Ineffective treatments:  None tried Associated symptoms: no back pain, no claudication, no cough, no diaphoresis, no palpitations, no shortness of breath and no syncope   Risk factors: no high cholesterol   No leg pain, no travel, no surgeries.  No SOB, no DOE no exertional symptoms.  No palpitations.   History reviewed. No pertinent past medical history.  There are no active problems to display for this patient.   History reviewed. No pertinent surgical history.      Home Medications    Prior to Admission medications   Medication Sig Start Date End Date Taking? Authorizing Provider  cyclobenzaprine (FLEXERIL) 10 MG tablet Take 1 tablet (10 mg total) by mouth 2 (two) times daily as needed for muscle spasms. 01/21/19   McDonald, Mia A, PA-C  ibuprofen (ADVIL,MOTRIN) 600 MG tablet Take 1 tablet (600 mg total) by mouth every 6 (six) hours as needed. 01/21/19   McDonald, Laymond Purser, PA-C    Family History History reviewed. No pertinent family history.  Social History Social History   Tobacco Use  . Smoking status: Light Tobacco Smoker  . Smokeless tobacco: Never Used  Substance Use Topics  . Alcohol use: No  . Drug use: No     Allergies   Patient has no known allergies.   Review of Systems Review of Systems  Constitutional: Negative for diaphoresis.   Respiratory: Negative for cough and shortness of breath.   Cardiovascular: Positive for chest pain. Negative for palpitations, claudication, leg swelling and syncope.  Musculoskeletal: Negative for back pain.  All other systems reviewed and are negative.    Physical Exam Updated Vital Signs BP 122/85 (BP Location: Left Arm)   Pulse (!) 56   Temp 98.6 F (37 C) (Oral)   Resp (!) 22   Ht 5\' 11"  (1.803 m)   Wt 79.4 kg   SpO2 100%   BMI 24.41 kg/m   Physical Exam Vitals signs and nursing note reviewed.  Constitutional:      General: He is not in acute distress.    Appearance: He is normal weight.  HENT:     Head: Normocephalic and atraumatic.  Eyes:     Pupils: Pupils are equal, round, and reactive to light.  Neck:     Musculoskeletal: Normal range of motion and neck supple.  Cardiovascular:     Rate and Rhythm: Normal rate and regular rhythm.     Pulses: Normal pulses.     Heart sounds: Normal heart sounds.  Pulmonary:     Effort: Pulmonary effort is normal. No respiratory distress.     Breath sounds: Normal breath sounds. No wheezing.  Abdominal:     General: Abdomen is flat. Bowel sounds are normal.     Tenderness: There is no abdominal tenderness. There is no  guarding.  Musculoskeletal: Normal range of motion.        General: No tenderness.     Right lower leg: No edema.     Left lower leg: No edema.  Skin:    General: Skin is warm and dry.     Capillary Refill: Capillary refill takes less than 2 seconds.  Neurological:     General: No focal deficit present.     Mental Status: He is alert and oriented to person, place, and time.  Psychiatric:        Mood and Affect: Mood normal.        Behavior: Behavior normal.      ED Treatments / Results  Labs (all labs ordered are listed, but only abnormal results are displayed) Results for orders placed or performed during the hospital encounter of 06/07/19  Basic metabolic panel  Result Value Ref Range   Sodium  137 135 - 145 mmol/L   Potassium 3.8 3.5 - 5.1 mmol/L   Chloride 101 98 - 111 mmol/L   CO2 25 22 - 32 mmol/L   Glucose, Bld 101 (H) 70 - 99 mg/dL   BUN 9 6 - 20 mg/dL   Creatinine, Ser 8.290.87 0.61 - 1.24 mg/dL   Calcium 9.2 8.9 - 56.210.3 mg/dL   GFR calc non Af Amer >60 >60 mL/min   GFR calc Af Amer >60 >60 mL/min   Anion gap 11 5 - 15  CBC  Result Value Ref Range   WBC 5.0 4.0 - 10.5 K/uL   RBC 5.55 4.22 - 5.81 MIL/uL   Hemoglobin 15.8 13.0 - 17.0 g/dL   HCT 13.047.2 86.539.0 - 78.452.0 %   MCV 85.0 80.0 - 100.0 fL   MCH 28.5 26.0 - 34.0 pg   MCHC 33.5 30.0 - 36.0 g/dL   RDW 69.612.3 29.511.5 - 28.415.5 %   Platelets 220 150 - 400 K/uL   nRBC 0.0 0.0 - 0.2 %  Troponin I (High Sensitivity)  Result Value Ref Range   Troponin I (High Sensitivity) <2 <18 ng/L   Dg Chest 2 View  Result Date: 06/07/2019 CLINICAL DATA:  Chest pain EXAM: CHEST - 2 VIEW COMPARISON:  None. FINDINGS: The heart size and mediastinal contours are within normal limits. Both lungs are clear. The visualized skeletal structures are unremarkable. IMPRESSION: No active cardiopulmonary disease. Electronically Signed   By: Deatra RobinsonKevin  Herman M.D.   On: 06/07/2019 00:09    EKG EKG Interpretation  Date/Time:  Sunday June 06 2019 23:50:19 EDT Ventricular Rate:  53 PR Interval:    QRS Duration: 90 QT Interval:  401 QTC Calculation: 377 R Axis:   73 Text Interpretation:  Sinus rhythm Confirmed by Angellina Ferdinand ([id number]) on 06/07/2019 1:49:55 AM   Radiology Dg Chest 2 View  Result Date: 06/07/2019 CLINICAL DATA:  Chest pain EXAM: CHEST - 2 VIEW COMPARISON:  None. FINDINGS: The heart size and mediastinal contours are within normal limits. Both lungs are clear. The visualized skeletal structures are unremarkable. IMPRESSION: No active cardiopulmonary disease. Electronically Signed   By: Deatra RobinsonKevin  Herman M.D.   On: 06/07/2019 00:09    Procedures Procedures (including critical care time)  Medications Ordered in ED Medications  sodium chloride  flush (NS) 0.9 % injection 3 mL (3 mLs Intravenous Not Given 06/07/19 0231)  acetaminophen (TYLENOL) tablet 1,000 mg (1,000 mg Oral Given 06/07/19 0229)  ibuprofen (ADVIL) tablet 800 mg (800 mg Oral Given 06/07/19 0230)  alum & mag hydroxide-simeth (MAALOX/MYLANTA) 200-200-20 MG/5ML suspension  30 mL (30 mLs Oral Given 06/07/19 0230)    And  lidocaine (XYLOCAINE) 2 % viscous mouth solution 15 mL (15 mLs Oral Given 06/07/19 0230)    Likely MSK, PERC negative Wells 0 highly doubt PE in this low risk patient.  No suspicion of ACS Final Clinical Impressions(s) / ED Diagnoses   Return for intractable cough, coughing up blood,fevers >100.4 unrelieved by medication, shortness of breath, intractable vomiting, chest pain, shortness of breath, weakness,numbness, changes in speech, facial asymmetry,abdominal pain, passing out,Inability to tolerate liquids or food, cough, altered mental status or any concerns. No signs of systemic illness or infection. The patient is nontoxic-appearing on exam and vital signs are within normal limits.   I have reviewed the triage vital signs and the nursing notes. Pertinent labs &imaging results that were available during my care of the patient were reviewed by me and considered in my medical decision making (see chart for details).  After history, exam, and medical workup I feel the patient has been appropriately medically screened and is safe for discharge home. Pertinent diagnoses were discussed with the patient. Patient was given return precautions    Shaterria Sager, MD 06/07/19 [id number]

## 2019-08-03 ENCOUNTER — Emergency Department (HOSPITAL_COMMUNITY)
Admission: EM | Admit: 2019-08-03 | Discharge: 2019-08-03 | Disposition: A | Discharge status: Home / Self Care | Payer: No Typology Code available for payment source | Attending: Emergency Medicine | Admitting: Emergency Medicine

## 2019-08-03 ENCOUNTER — Emergency Department (HOSPITAL_COMMUNITY): Payer: No Typology Code available for payment source

## 2019-08-03 ENCOUNTER — Encounter (HOSPITAL_COMMUNITY): Payer: Self-pay

## 2019-08-03 ENCOUNTER — Other Ambulatory Visit: Payer: Self-pay

## 2019-08-03 DIAGNOSIS — Z79899 Other long term (current) drug therapy: Secondary | ICD-10-CM | POA: Insufficient documentation

## 2019-08-03 DIAGNOSIS — Z720 Tobacco use: Secondary | ICD-10-CM | POA: Insufficient documentation

## 2019-08-03 DIAGNOSIS — M79604 Pain in right leg: Secondary | ICD-10-CM | POA: Insufficient documentation

## 2019-08-03 MED ORDER — IBUPROFEN 800 MG PO TABS: 800.0000 mg | ORAL_TABLET | Freq: Three times a day (TID) | ORAL | 0 refills | Status: AC
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
## 2019-08-03 NOTE — Discharge Instructions (Signed)
Take the prescribed medication as directed.  Can use ice to help with swelling if needed. Follow-up with your primary care doctor. Return to the ED for new or worsening symptoms.
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
## 2020-10-18 IMAGING — CR CHEST - 2 VIEW
2 series · 2 of 2 positions shown · non-contrast
Comparison: None.

CLINICAL DATA: Chest pain

EXAM:
CHEST - 2 VIEW

[w chest pa]
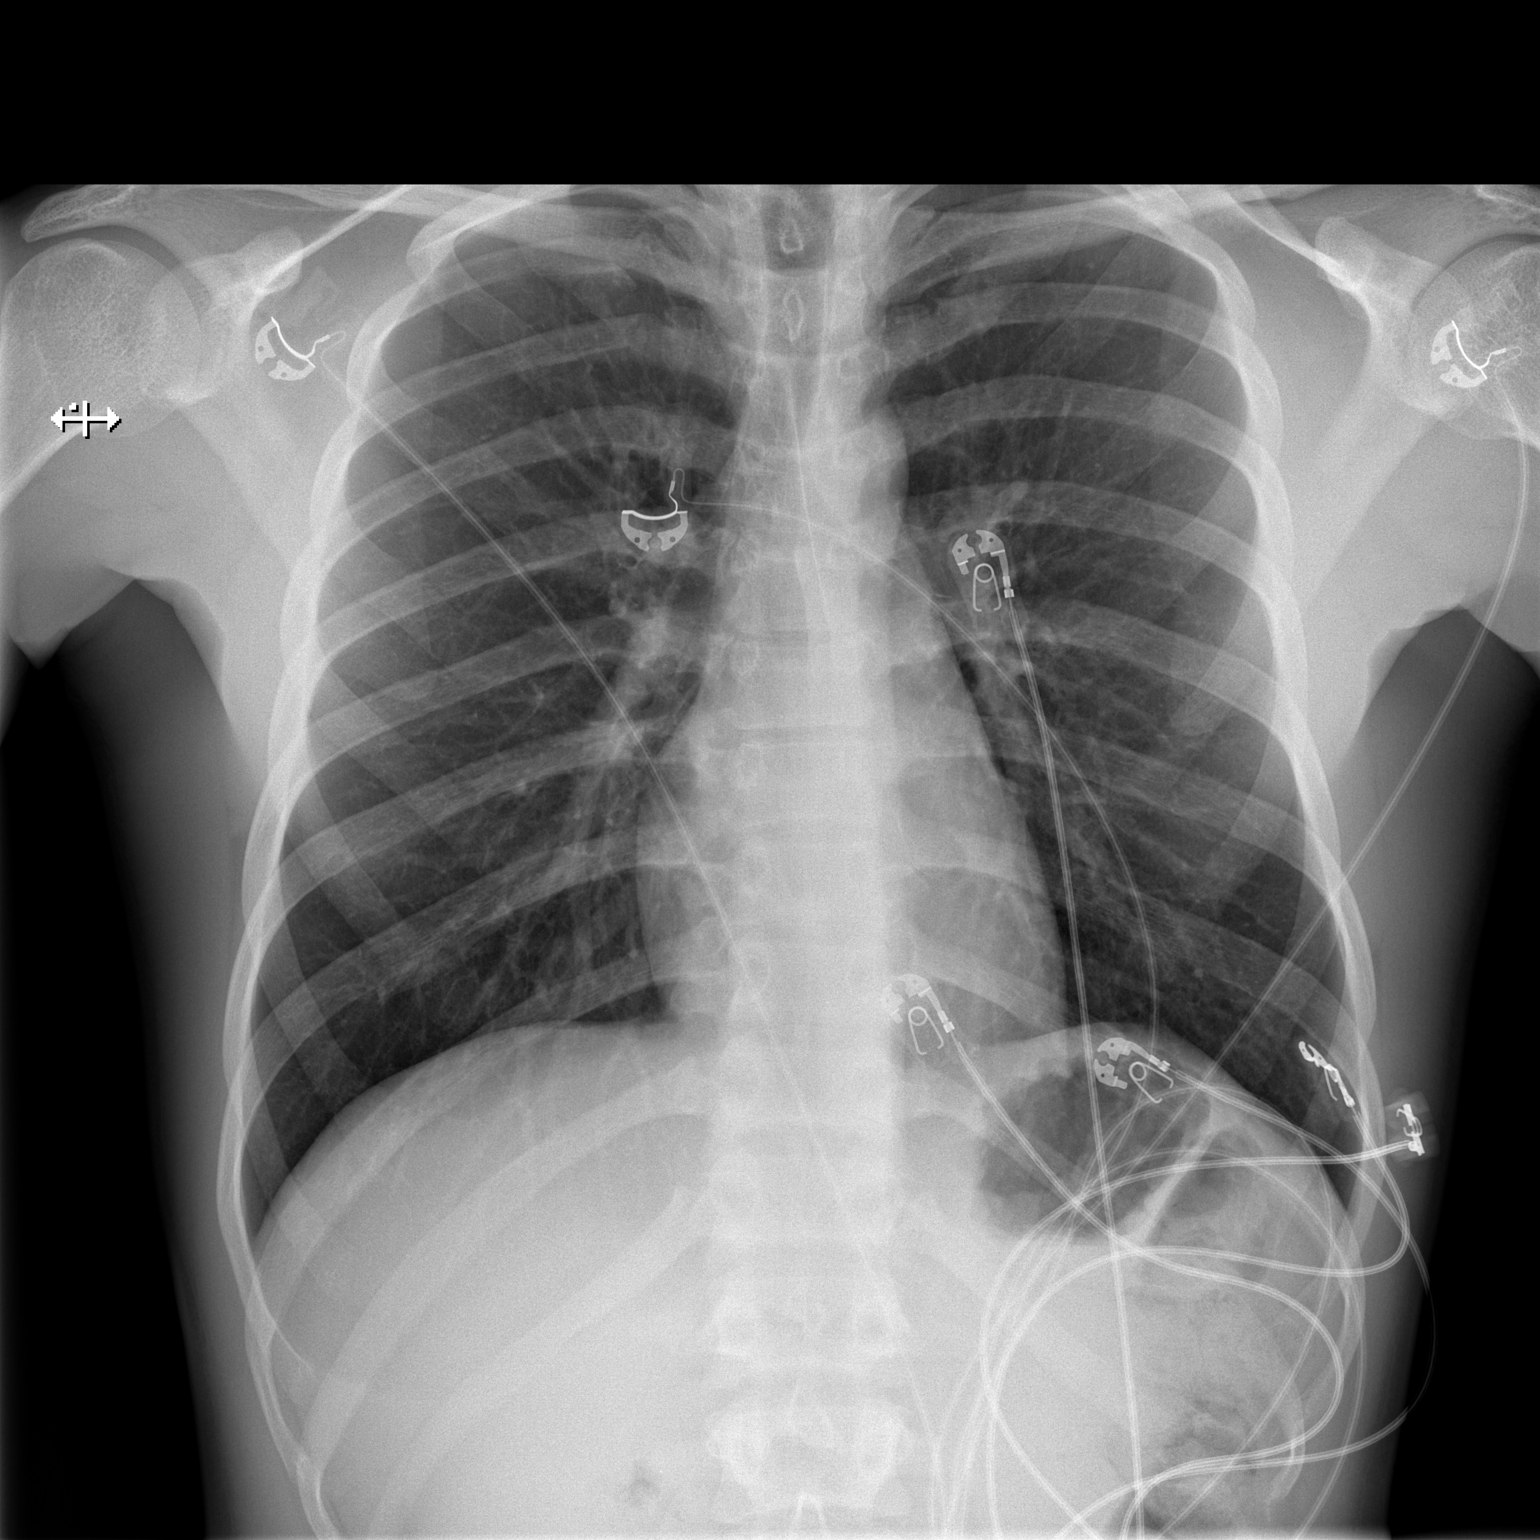

[w chest lat]
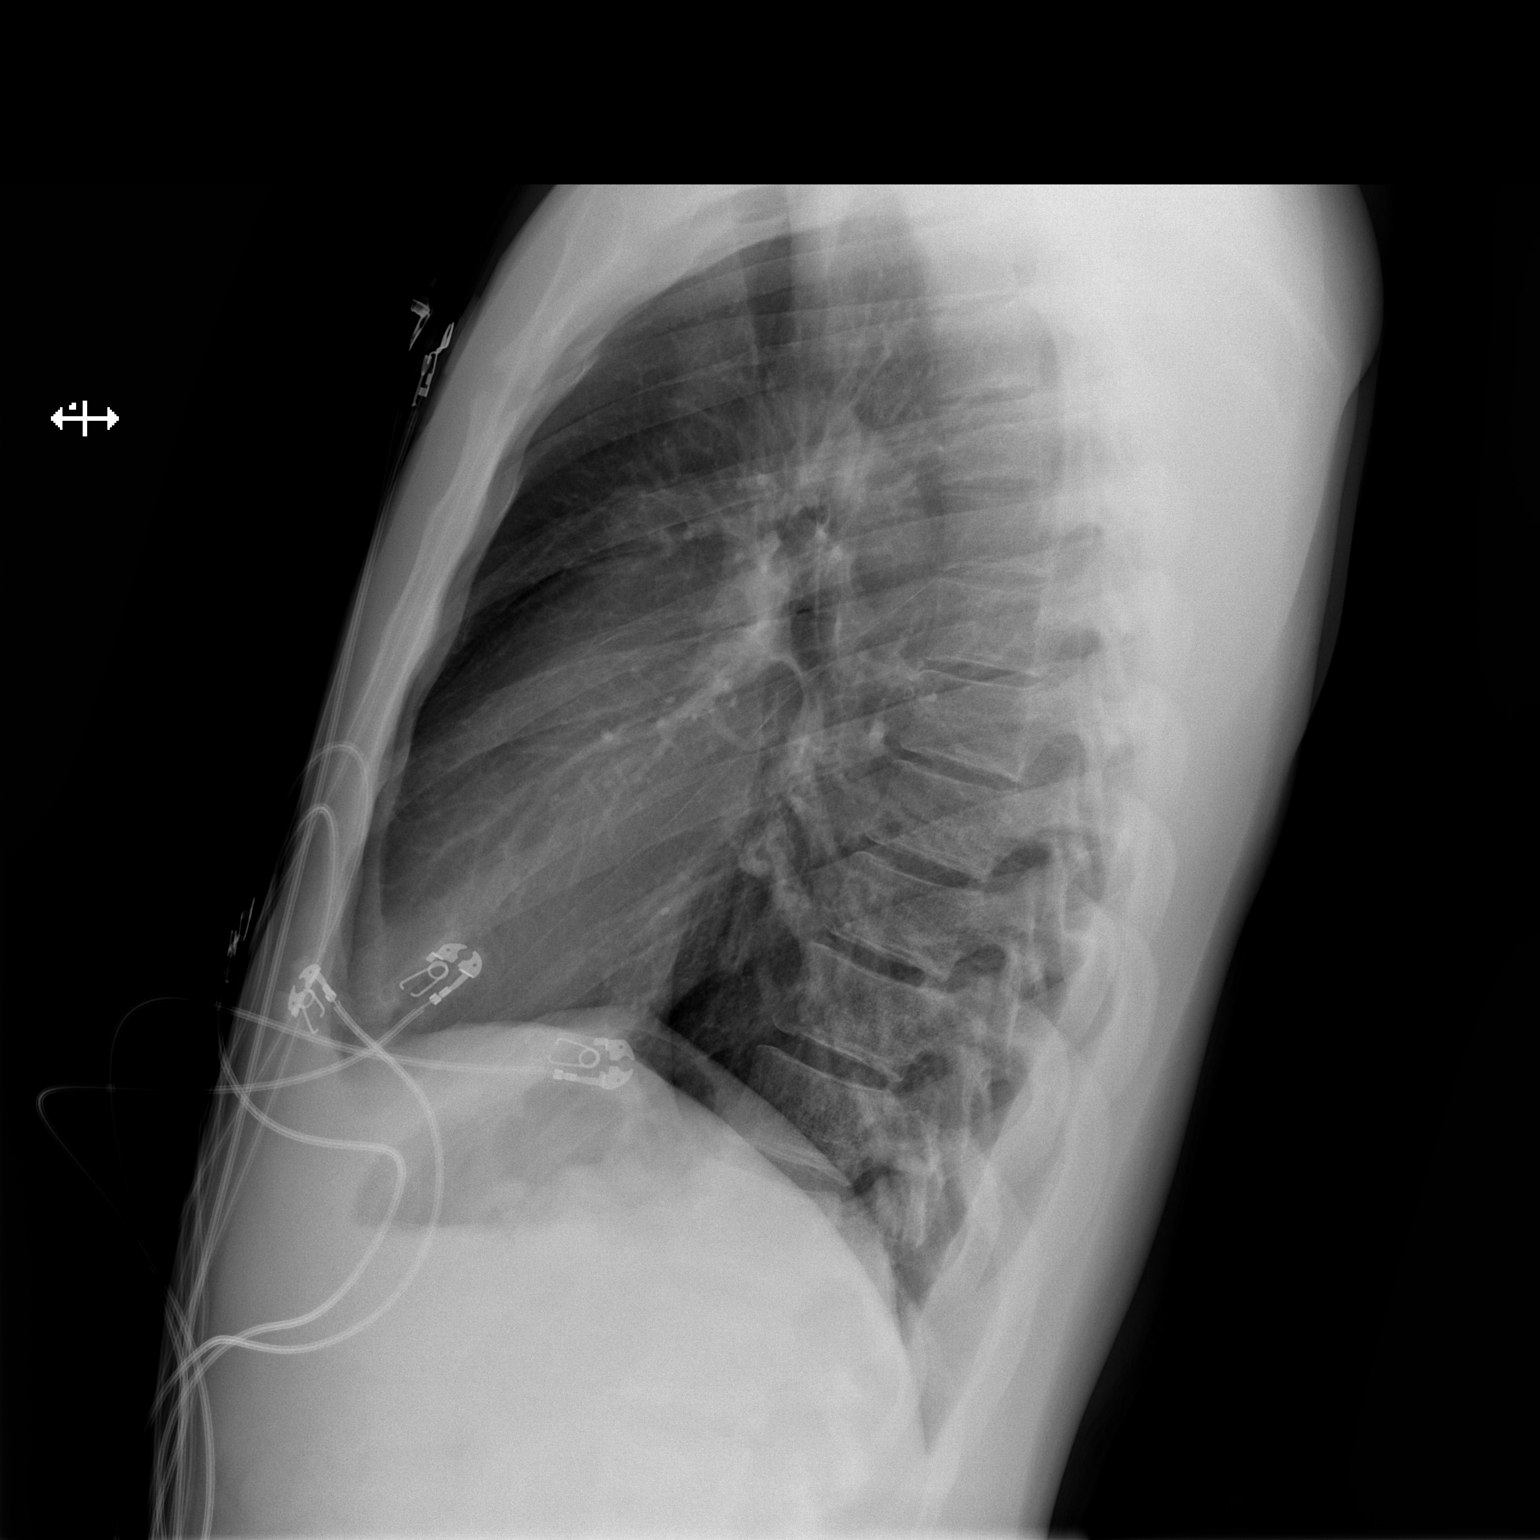

[2 of 2 positions shown; findings below may reference images not displayed]

FINDINGS: The heart size and mediastinal contours are within normal limits.
Both lungs are clear. The visualized skeletal structures are
unremarkable.
IMPRESSION: No active cardiopulmonary disease.
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
## 2021-03-01 NOTE — ED Notes (Signed)
ED Triage Note       ED Secondary Triage Entered On:  03/01/2021 17:42 EDT    Performed On:  03/01/2021 17:42 EDT by Daphine Deutscher, RN, BRANDON               General Information   Barriers to Learning :   None evident   ED Home Meds Section :   Document assessment   [facility] ED Fall Risk Section :   Document assessment   ED Advance Directives Section :   Document assessment   ED Palliative Screen :   N/A (prefilled for <25yo)   Daphine Deutscher RN, Apolinar Junes - 03/01/2021 17:42 EDT   (As Of: 03/01/2021 17:42:28 EDT)   Diagnoses(Active)    Wound evaluation  Date:   03/01/2021 ; Diagnosis Type:   Reason For Visit ; Confirmation:   Complaint of ; Clinical Dx:   Wound evaluation ; Classification:   Medical ; Clinical Service:   Non-Specified ; Code:   PNED ; Probability:   0 ; Diagnosis Code:   [id number]             -    Procedure History   (As Of: 03/01/2021 17:42:28 EDT)     [facility] Perch Fall Risk Assessment Tool   Hx of falling last 3 months ED Fall :   No   Patient confused or disoriented ED Fall :   No   Patient intoxicated or sedated ED Fall :   No   Patient impaired gait ED Fall :   No   Use a mobility assistance device ED Fall :   No   Patient altered elimination ED Fall :   No   [facility] ED Fall Score :   0    Roylene Reason - 03/01/2021 17:42 EDT   ED Advance Directive   Advance Directive :   No   Daphine Deutscher RN, Apolinar Junes - 03/01/2021 17:42 EDT   Med Hx   Medication List   (As Of: 03/01/2021 17:42:28 EDT)
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
## 2021-08-04 NOTE — ED Notes (Signed)
ED Patient Education Note     Patient Education Materials Follows:  Dermatology     Skin Foreign Body    A skin foreign body is an object that is stuck in the skin. Common objects that get stuck in the skin include:   Wood (splinter).     Glass.     Rock.     Nails.     Needles.     Thorns or cactus spines.     Fiberglass slivers.     Fish hooks.     BBs.      Foreign bodies may damage tissue or cause infection. If the foreign body does not cause any pain or infection, it may be okay to leave it in the skin.    A growth called a granuloma may form around a foreign body that is left in the skin.      What are the causes?    This condition is caused by an object getting lodged under the skin, usually by accident. Children may get a skin foreign body while playing outside. Adults may get a skin foreign body after breaking glass or while working with wood, fiberglass, or stone material. In some cases, the object may get stuck in an open wound after an injury.      What are the signs or symptoms?    Symptoms of this condition include:   Pain.     A feeling of something being stuck under the skin.        How is this diagnosed?    This condition is diagnosed based on:   Your medical history and symptoms.     A physical exam.     Imaging tests, such as:  ? X-rays.    ? CT scans.    ? Ultrasounds.          How is this treated?    Treatment for this condition depends on what the foreign body is, where it is, and whether it is causing infection or other symptoms. Treatment may involve:   Flushing the affected area with a salt-water solution to remove dirt or debris.     Removing all or part of the object with a needle and metal tweezers. In some cases, an incision may be made in the skin to allow access to the object.     Waiting to remove the object until it moves closer to the surface of the skin. This may take several days.     Leaving the object in place. This may be done if the object is not causing any symptoms or if  removal will cause more damage to the skin or tissue.     Taking antibiotic pills or using antibiotic ointment to treat or prevent infection.     Having a surgical procedure to remove a foreign body that is deep inside the tissue or that has been covered by a granuloma.        Follow these instructions at home:    Wound or incision care       If the foreign body was removed, follow instructions from your health care provider about how to take care of your wound or incision. Make sure you:  ? Wash your hands with soap and water before and after you change your bandage (dressing). If soap and water are not available, use hand sanitizer.    ? Change your dressing as told by your health care provider.    ? Leave  stitches (sutures), skin glue, or adhesive strips in place. These skin closures may need to stay in place for 2 weeks or longer. If adhesive strip edges start to loosen and curl up, you may trim the loose edges. Do not remove adhesive strips completely unless your health care provider tells you to do that.       Check your wound or incision every day for signs of infection. This is especially important if the foreign body was left in place in the skin. Check for:  ? Redness, swelling, or pain.    ? Fluid or blood.    ? Pus or a bad smell.    ? Warmth.       If the foreign body was in your lip, you may be directed to rinse your mouth with a salt-water mixture 3?4 times per day or as needed. To make a salt?water mixture, completely dissolve ??1 tsp (3?6 g) of salt in 1 cup (237 mL) of warm water.      General instructions     Take over-the-counter and prescription medicines only as told by your health care provider.     If you were prescribed an antibiotic medicine or ointment, use it as told by your health care provider. Do not stop using the antibiotic even if you start to feel better.     Keep all follow-up visits as told by your health care provider. This is important.        Contact a health care provider  if:     You develop more pain or other new symptoms around the area where the object entered the skin.     You have redness, swelling, or pain around your wound or incision.     You have fluid or blood coming from your wound or incision.     Your wound or incision feels warm to the touch.     You have pus or a bad smell coming from your wound or incision.     You have a fever.      Get help right away if:     You have severe pain that does not get better with medicine.      Summary     A skin foreign body is an object that is stuck in the skin. Common objects that get stuck in the skin include wood, glass, rock, thorns, and fiberglass slivers.     Treatment for this condition depends on what the foreign body is, where it is, and whether it is causing infection or other symptoms.     Treatment may include removing the foreign body or leaving it in place. It is important to watch the wound or incision for signs of infection, especially if the object was left in place in the skin.      This information is not intended to replace advice given to you by your health care provider. Make sure you discuss any questions you have with your health care provider.      Document Revised: 02/25/2019 Document Reviewed: 02/05/2017  Elsevier Patient Education ? 2021 [employer]
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
## 2021-08-23 LAB — CBC WITH AUTO DIFFERENTIAL
Absolute Baso #: 0 10*3/uL (ref 0.0–0.2)
Absolute Eos #: 0 10*3/uL (ref 0.0–0.5)
Absolute Lymph #: 1 10*3/uL (ref 1.0–3.2)
Absolute Mono #: 0.5 10*3/uL (ref 0.3–1.0)
Basophils %: 0.1 % (ref 0.0–2.0)
Eosinophils %: 0.1 % (ref 0.0–7.0)
Hematocrit: 42.4 % (ref 38.0–52.0)
Hemoglobin: 14.5 g/dL (ref 13.0–17.3)
Immature Grans (Abs): 0.02 10*3/uL (ref 0.00–0.06)
Immature Granulocytes: 0.3 % (ref 0.0–0.6)
Lymphocytes: 13.8 % — ABNORMAL LOW (ref 15.0–45.0)
MCH: 28.3 pg (ref 27.0–34.5)
MCHC: 34.2 g/dL (ref 32.0–36.0)
MCV: 82.7 fL — ABNORMAL LOW (ref 84.0–100.0)
MPV: 9 fL (ref 7.2–13.2)
Monocytes: 7 % (ref 4.0–12.0)
NRBC Absolute: 0 10*3/uL (ref 0.000–0.012)
NRBC Automated: 0 % (ref 0.0–0.2)
Neutrophils %: 78.7 % — ABNORMAL HIGH (ref 42.0–74.0)
Neutrophils Absolute: 5.9 10*3/uL (ref 1.6–7.3)
Platelets: 210 10*3/uL (ref 140–440)
RBC: 5.13 x10e6/mcL (ref 4.00–5.60)
RDW: 12.6 % (ref 11.0–16.0)
WBC: 7.5 10*3/uL (ref 3.8–10.6)

## 2021-08-23 LAB — COMPREHENSIVE METABOLIC PANEL
ALT: 22 U/L (ref 0–50)
AST: 22 U/L (ref 0–50)
Albumin/Globulin Ratio: 1.48 (ref 1.00–2.70)
Albumin: 4 g/dL (ref 3.5–5.2)
Alk Phosphatase: 59 U/L (ref 40–130)
Anion Gap: 10 mmol/L (ref 2–17)
BUN: 9 mg/dL (ref 6–20)
CO2: 25 mmol/L (ref 22–29)
Calcium: 8.7 mg/dL (ref 8.6–10.0)
Chloride: 102 mmol/L (ref 98–107)
Creatinine: 0.6 mg/dL — ABNORMAL LOW (ref 0.7–1.3)
Est, Glom Filt Rate: 138 mL/min/1.73m?? (ref >=90)
Globulin: 2.7 g/dL (ref 1.9–4.4)
Glucose: 99 mg/dL (ref 70–99)
OSMOLALITY CALCULATED: 272 mOsm/kg (ref 270–287)
Potassium: 3.7 mmol/L (ref 3.5–5.3)
Sodium: 137 mmol/L (ref 135–145)
Total Bilirubin: 0.7 mg/dL (ref 0.00–1.20)
Total Protein: 6.7 g/dL (ref 6.4–8.3)

## 2021-08-23 LAB — POCT GLUCOSE: POC Glucose: 92 mg/dL (ref 65.0–110.0)

## 2021-08-23 NOTE — ED Notes (Signed)
ED Note-Nursing       ED RN Reassessment Entered On:  08/23/2021 2:24 EDT    Performed On:  08/23/2021 2:24 EDT by Hardie Lora, RN, Kristeen Miss               ED RN Reassessment   ED RN Progress Note :   unable to get blood on pt at this time. MD Dehnkamp aware.   Hardie Lora, RN, Zeriah - 08/23/2021 2:24 EDT

## 2021-08-23 NOTE — ED Notes (Signed)
ED Pre-Arrival Note        Pre-Arrival Summary    Name:  77,    Current Date:  08/23/2021 00:11:43 EDT  Gender:  Male  Date of Birth:    Age:  25  Pre-Arrival Type:  EMS  ETA:  08/23/2021 00:27:00 EDT  Primary Care Physician:    Presenting Problem:  ate edibles  Pre-Arrival User:  Jeanella Flattery, RN, Duwayne Heck K  Referring Source:    Location:  [facility]            PreArrival Communication Form  Emergency Department        Additional Patient Information:        Orders:  [    ] CBC                                            [     ] CT Head no contrast  [    ] BMP                                           [     ] CT Abdomen/Pelvis no contrast  [    ] PT/INR                                       [     ] CT Abdomen/Pelvis IV contrast, w/ oral contrast  [    ] Troponin                                   [     ] CT Abdomen/Pelvis IV contrast, no oral contrast  [    ] BNP                                            [     ] See ordersheet  [    ] CXR                                             [     ] Other:__________________________  [    ] EKG

## 2021-08-23 NOTE — ED Notes (Signed)
ED Note-Nursing       ED RN Reassessment Entered On:  08/23/2021 4:49 EDT    Performed On:  08/23/2021 4:49 EDT by Hardie Lora, RN, Zeriah               ED RN Reassessment   ED RN Progress Note :   Pt awake at this time and able to ambulate to bathroom independently.    Hardie Lora, RN, Kristeen Miss - 08/23/2021 4:49 EDT

## 2021-08-23 NOTE — ED Notes (Signed)
 ED Triage Note       ED Triage Adult Entered On:  08/23/2021 0:22 EDT    Performed On:  08/23/2021 0:11 EDT by Leroy, RN, Harlene HERO               Triage   Numeric Rating Pain Scale :   0 = No pain   Chief Complaint :   Pt ate 19 Delta 8 gummies on Tuesday night. Has been vomiting since. Pt vomiting in triage and has vomit on shirt. Pt A&Ox 4. States he feels a little  like he is out of his body. Pt skin cold and dry. Pt sleeping in triage but wakes to answer questions   [place] Mode of Arrival :   Ambulance   Infectious Disease Documentation :   Document assessment   Temperature Oral :   35.1 degC(Converted to: 95.2 degF)  (LOW)    Heart Rate Monitored :   77 bpm   Respiratory Rate :   16 br/min   Systolic Blood Pressure :   105 mmHg   Diastolic Blood Pressure :   51 mmHg (LOW)    SpO2 :   99 %   Oxygen Therapy :   Room air   Patient presentation :   Fever >/= 38.0 C or </= 36.0 C, None of the above   Chief Complaint or Presentation suggest infection :   Yes   Dosing Weight Obtained By :   Patient stated   Weight Dosing :   77 kg(Converted to: 169 lb 12 oz)    Height :   180 cm(Converted to: 5 ft 11 in)    Body Mass Index Dosing :   24 kg/m2   Hadwin, RN, Harlene HERO - 08/23/2021 0:11 EDT   DCP GENERIC CODE   Tracking Acuity :   3   Tracking Group :   ED [address] Tracking Group   Keats, RN, Harlene HERO - 08/23/2021 0:11 EDT   ED General Section :   Document assessment   Pregnancy Status :   N/A   ED Allergies Section :   Document assessment   ED Reason for Visit Section :   Document assessment   Leroy RN, Harlene HERO - 08/23/2021 0:11 EDT   PTA/Triage Treatments   ED PTA Pre-Arrival Service :   [facility]   Adelino, [place], Harlene HERO - 08/23/2021 0:11 EDT   ID Risk Screen Symptoms   Recent Travel History :   No recent travel   TB Symptom Screen :   No symptoms   Last 90 days COVID-19 ID :   No   Close Contact with COVID-19 ID :   No   Last 14 days COVID-19 ID :   No   Hadwin, RN, Harlene HERO - 08/23/2021 0:11 EDT    Allergies   (As Of: 08/23/2021 00:22:22 EDT)   Allergies (Active)   No Known Allergies  Estimated Onset Date:   Unspecified ; Created By:   Curtis,  Jaclynne A-RN; Reaction Status:   Active ; Category:   Drug ; Substance:   No Known Allergies ; Type:   Allergy ; Updated By:   Curtis,  Jaclynne A-RN; Reviewed Date:   08/23/2021 0:21 EDT        Psycho-Social   Last 3 mo, thoughts killing self/others :   Patient denies   Right click within box for Suspected Abuse policy link. :   None   Feels Safe Where  Live :   Yes   ED Behavioral Activity Rating Scale :   4 - Quiet and awake (normal level of activity)   Hadwin, RN, Harlene HERO - 08/23/2021 0:11 EDT   ED Reason for Visit   (As Of: 08/23/2021 00:22:22 EDT)   Diagnoses(Active)    Unintentional ingestion - overdose  Date:   08/23/2021 ; Diagnosis Type:   Reason For Visit ; Confirmation:   Complaint of ; Clinical Dx:   Unintentional ingestion - overdose ; Classification:   Medical ; Clinical Service:   Emergency medicine ; Code:   PNED ; Probability:   0 ; Diagnosis Code:   [phone] F

## 2021-08-23 NOTE — ED Notes (Signed)
ED Triage Note       ED Secondary Triage Entered On:  08/23/2021 1:23 EDT    Performed On:  08/23/2021 1:23 EDT by Hardie Lora, RN, Zeriah               General Information   Barriers to Learning :   None evident   Languages :   English   Pt. Currently Receiving Radiation :   No   ED Home Meds Section :   Document assessment   [facility] ED Fall Risk Section :   Document assessment   ED Advance Directives Section :   Document assessment   ED Palliative Screen :   N/A (prefilled for <65yo)   Hardie Lora RN, Kristeen Miss - 08/23/2021 1:23 EDT   (As Of: 08/23/2021 01:23:34 EDT)   Problems(Active)    No Chronic Problems ([facility]  :NKP )  Name of Problem:   No Chronic Problems ; Recorder:   Hardie Lora, RN, Kristeen Miss; Code:   NKP ; Last Updated:   08/23/2021 1:23 EDT ; Life Cycle Date:   08/23/2021 ; Life Cycle Status:   Active ; Vocabulary:   [facility]          Diagnoses(Active)    Unintentional ingestion - overdose  Date:   08/23/2021 ; Diagnosis Type:   Reason For Visit ; Confirmation:   Complaint of ; Clinical Dx:   Unintentional ingestion - overdose ; Classification:   Medical ; Clinical Service:   Emergency medicine ; Code:   PNED ; Probability:   0 ; Diagnosis Code:   [phone] F             -    Procedure History   (As Of: 08/23/2021 01:23:34 EDT)     Phoebe Perch Fall Risk Assessment Tool   Hx of falling last 3 months ED Fall :   No   Patient confused or disoriented ED Fall :   No   Patient intoxicated or sedated ED Fall :   No   Patient impaired gait ED Fall :   No   Use a mobility assistance device ED Fall :   No   Patient altered elimination ED Fall :   No   [facility] ED Fall Score :   0    Hardie Lora RN, Kristeen Miss - 08/23/2021 1:23 EDT   ED Advance Directive   Advance Directive :   No   Hardie Lora, RN, Zeriah - 08/23/2021 1:23 EDT   Med Hx   Medication List   (As Of: 08/23/2021 01:23:34 EDT)   Normal Order    Dextrose 5% with 0.9% NaCl intravenous solution  :   Dextrose 5% with 0.9% NaCl intravenous solution ; Status:   Ordered ;  Ordered As Mnemonic:   D5-0.9NaCl bolus ; Simple Display Line:   1,000 mL, 1000 mL/hr, IV Piggyback, Once ; Ordering Provider:   Henrene Pastor,  Wade-MD; Catalog Code:   Dextrose 5% with 0.9% NaCl ; Order Dt/Tm:   08/23/2021 01:08:43 EDT          ondansetron 2 mg/mL Inj Soln 2 mL  :   ondansetron 2 mg/mL Inj Soln 2 mL ; Status:   Ordered ; Ordered As Mnemonic:   Zofran ; Simple Display Line:   4 mg, 2 mL, IV Push, Once ; Ordering Provider:   Henrene Pastor,  Wade-MD; Catalog Code:   ondansetron ; Order Dt/Tm:   08/23/2021 84:13:24 EDT

## 2021-08-23 NOTE — ED Notes (Signed)
ED Note-Nursing       ED RN Reassessment Entered On:  08/23/2021 2:46 EDT    Performed On:  08/23/2021 2:45 EDT by Hardie Lora, RN, Kristeen Miss               ED RN Reassessment   ED RN Progress Note :   Unable to get oral temp to read at this time. MD Dehnkamp informed. Warm blankets applied to patient as ordered.    Hardie Lora, RN, Zeriah - 08/23/2021 2:45 EDT

## 2021-08-23 NOTE — ED Notes (Signed)
ED Note-Nursing       ED RN Reassessment Entered On:  08/23/2021 6:31 EDT    Performed On:  08/23/2021 6:30 EDT by Hardie Lora, RN, Kristeen Miss               ED RN Reassessment   ED RN Progress Note :   RN spoke with patient mother on phone. Mother verbalizes she will pick up patient from waiting room when she finishes her bus route at 8:15am. Pt cleared to sit in waiting room by Henrene Pastor MD.   Hardie Lora, RN, Zeriah - 08/23/2021 6:30 EDT

## 2021-08-23 NOTE — ED Notes (Signed)
ED Patient Education Note     Patient Education Materials Follows:  Nutrition     Rehydration, Adult      Rehydration is the replacement of body fluids, salts, and minerals (electrolytes) that are lost during dehydration. Dehydration is when there is not enough water or other fluids in the body. This happens when you lose more fluids than you take in. Common causes of dehydration include:   Not drinking enough fluids. This can occur when you are ill or doing activities that require a lot of energy, especially in hot weather.     Conditions that cause loss of water or other fluids, such as diarrhea, vomiting, sweating, or urinating a lot.     Other illnesses, such as fever or infection.     Certain medicines, such as those that remove excess fluid from the body (diuretics).      Symptoms of mild or moderate dehydration may include thirst, dry lips and mouth, and dizziness. Symptoms of severe dehydration may include increased heart rate, confusion, fainting, and not urinating.    For severe dehydration, you may need to get fluids through an IV at the hospital. For mild or moderate dehydration, you can usually rehydrate at home by drinking certain fluids as told by your health care provider.      What are the risks?    Generally, rehydration is safe. However, taking in too much fluid (overhydration) can be a problem. This is rare. Overhydration can cause an electrolyte imbalance, kidney failure, or a decrease in salt (sodium) levels in the body.      Supplies needed    You will need an oral rehydration solution (ORS) if your health care provider tells you to use one. This is a drink to treat dehydration. It can be found in pharmacies and retail stores.      How to rehydrate    Fluids    Follow instructions from your health care provider for rehydration. The kind of fluid and the amount you should drink depend on your condition. In general, you should choose drinks that you prefer.   If told by your health care  provider, drink an ORS.  ? Make an ORS by following instructions on the package.    ? Start by drinking small amounts, about ? cup (120 mL) every 5?10 minutes.    ? Slowly increase how much you drink until you have taken the amount recommended by your health care provider.       Drink enough clear fluids to keep your urine pale yellow. If you were told to drink an ORS, finish it first, then start slowly drinking other clear fluids. Drink fluids such as:  ? Water. This includes sparkling water and flavored water. Drinking only water can lead to having too little sodium in your body (hyponatremia). Follow the advice of your health care provider.    ? Water from ice chips you suck on.    ? Fruit juice with water you add to it (diluted).    ? Sports drinks.    ? Hot or cold herbal teas.    ? Broth-based soups.    ? Milk or milk products.          Food      Follow instructions from your health care provider about what to eat while you rehydrate. Your health care provider may recommend that you slowly begin eating regular foods in small amounts.   Eat foods that contain a healthy balance   of electrolytes, such as bananas, oranges, potatoes, tomatoes, and spinach.     Avoid foods that are greasy or contain a lot of sugar.      In some cases, you may get nutrition through a feeding tube that is passed through your nose and into your stomach (nasogastric tube, or NG tube). This may be done if you have uncontrolled vomiting or diarrhea.      Beverages to avoid      Certain beverages may make dehydration worse. While you rehydrate, avoid drinking alcohol.        How to tell if you are recovering from dehydration    You may be recovering from dehydration if:   You are urinating more often than before you started rehydrating.     Your urine is pale yellow.     Your energy level improves.     You vomit less frequently.     You have diarrhea less frequently.     Your appetite improves or returns to normal.     You feel less dizzy  or less light-headed.     Your skin tone and color start to look more normal.        Follow these instructions at home:     Take over-the-counter and prescription medicines only as told by your health care provider.     Do not take sodium tablets. Doing this can lead to having too much sodium in your body (hypernatremia).      Contact a health care provider if:     You continue to have symptoms of mild or moderate dehydration, such as:  ? Thirst.    ? Dry lips.    ? Slightly dry mouth.    ? Dizziness.    ? Dark urine or less urine than normal.    ? Muscle cramps.       You continue to vomit or have diarrhea.      Get help right away if you:     Have symptoms of dehydration that get worse.     Have a fever.     Have a severe headache.     Have been vomiting and the following happens:  ? Your vomiting gets worse or does not go away.    ? Your vomit includes blood or green matter (bile).    ? You cannot eat or drink without vomiting.       Have problems with urination or bowel movements, such as:  ? Diarrhea that gets worse or does not go away.    ? Blood in your stool (feces). This may cause stool to look black and tarry.    ? Not urinating, or urinating only a small amount of very dark urine, within 6?8 hours.       Have trouble breathing.     Have symptoms that get worse with treatment.    These symptoms may represent a serious problem that is an emergency. Do not wait to see if the symptoms will go away. Get medical help right away. Call your local emergency services (911 in the [place] Do not drive yourself to the hospital.      Summary     Rehydration is the replacement of body fluids and minerals (electrolytes) that are lost during dehydration.     Follow instructions from your health care provider for rehydration. The kind of fluid and amount you should drink depend on your condition.     Slowly increase how much   you drink until you have taken the amount recommended by your health care provider.     Contact your  health care provider if you continue to show signs of mild or moderate dehydration.      This information is not intended to replace advice given to you by your health care provider. Make sure you discuss any questions you have with your health care provider.      Document Revised: 01/05/2020 Document Reviewed: 11/15/2019  [employer]vier Patient Education ? [employer].         Dehydration, Adult    Dehydration is a condition in which there is not enough water or other fluids in the body. This happens when a person loses more fluids than he or she takes in. Important organs, such as the kidneys, brain, and heart, cannot function without a proper amount of fluids. Any loss of fluids from the body can lead to dehydration.    Dehydration can be mild, moderate, or severe. It should be treated right away to prevent it from becoming severe.      What are the causes?    Dehydration may be caused by:   Conditions that cause loss of water or other fluids, such as diarrhea, vomiting, or sweating or urinating a lot.     Not drinking enough fluids, especially when you are ill or doing activities that require a lot of energy.     Other illnesses and conditions, such as fever or infection.     Certain medicines, such as medicines that remove excess fluid from the body (diuretics).     Lack of safe drinking water.     Not being able to get enough water and food.        What increases the risk?    The following factors may make you more likely to develop this condition:   Having a long-term (chronic) illness that has not been treated properly, such as diabetes, heart disease, or kidney disease.     Being 79 years of age or older.     Having a disability.     Living in a place that is high in altitude, where thinner, drier air causes more fluid loss.     Doing exercises that put stress on your body for a long time (endurance sports).        What are the signs or symptoms?    Symptoms of dehydration depend on how severe it is.     Mild or moderate dehydration     Thirst.     Dry lips or dry mouth.     Dizziness or light-headedness, especially when standing up from a seated position.      Muscle cramps.      Dark urine. Urine may be the color of tea.      Less urine or tears produced than usual.     Headache.      Severe dehydration     Changes in skin. Your skin may be cold and clammy, blotchy, or pale. Your skin also may not return to normal after being lightly pinched and released.     Little or no tears, urine, or sweat.     Changes in vital signs, such as rapid breathing and low blood pressure. Your pulse may be weak or may be faster than 100 beats a minute when you are sitting still.     Other changes, such as:  ? Feeling very thirsty.    ? Sunken  eyes.    ? Cold hands and feet.    ? Confusion.    ? Being very tired (lethargic) or having trouble waking from sleep.    ? Short-term weight loss.    ? Loss of consciousness.          How is this diagnosed?    This condition is diagnosed based on your symptoms and a physical exam. You may have blood and urine tests to help confirm the diagnosis.      How is this treated?    Treatment for this condition depends on how severe it is. Treatment should be started right away. Do not wait until dehydration becomes severe. Severe dehydration is an emergency and needs to be treated in a hospital.   Mild or moderate dehydration can be treated at home. You may be asked to:   ? Drink more fluids.    ? Drink an oral rehydration solution (ORS). This drink helps restore proper amounts of fluids and salts and minerals in the blood (electrolytes).       Severe dehydration can be treated:  ? With IV fluids.    ? By correcting abnormal levels of electrolytes. This is often done by giving electrolytes through a tube that is passed through your nose and into your stomach (nasogastric tube, or NG tube).    ? By treating the underlying cause of dehydration.          Follow these instructions at home:    Oral  rehydration solution    If told by your health care provider, drink an ORS:   Make an ORS by following instructions on the package.     Start by drinking small amounts, about ? cup (120 mL) every 5?10 minutes.     Slowly increase how much you drink until you have taken the amount recommended by your health care provider.        Eating and drinking           Drink enough clear fluid to keep your urine pale yellow. If you were told to drink an ORS, finish the ORS first and then start slowly drinking other clear fluids. Drink fluids such as:  ? Water. Do not drink only water. Doing that can lead to hyponatremia, which is having too little salt (sodium) in the body.    ? Water from ice chips you suck on.    ? Fruit juice that you have added water to (diluted fruit juice).    ? Low-calorie sports drinks.       Eat foods that contain a healthy balance of electrolytes, such as bananas, oranges, potatoes, tomatoes, and spinach.     Do not drink alcohol.     Avoid the following:  ? Drinks that contain a lot of sugar. These include high-calorie sports drinks, fruit juice that is not diluted, and soda.    ? Caffeine.    ? Foods that are greasy or contain a lot of fat or sugar.        General instructions     Take over-the-counter and prescription medicines only as told by your health care provider.     Do not take sodium tablets. Doing that can lead to having too much sodium in the body (hypernatremia).     Return to your normal activities as told by your health care provider. Ask your health care provider what activities are safe for you.     Keep all follow-up visits as   told by your health care provider. This is important.        Contact a health care provider if:     You have muscle cramps, pain, or discomfort, such as:  ? Pain in your abdomen and the pain gets worse or stays in one area (localizes).    ? Stiff neck.       You have a rash.     You are more irritable than usual.     You are sleepier or have a harder time  waking than usual.     You feel weak or dizzy.     You feel very thirsty.      Get help right away if you have:     Any symptoms of severe dehydration.     Symptoms of vomiting, such as:  ? You cannot eat or drink without vomiting.    ? Vomiting gets worse or does not go away.    ? Vomit includes blood or green matter (bile).       Symptoms that get worse with treatment.     A fever.     A severe headache.     Problems with urination or bowel movements, such as:  ? Diarrhea that gets worse or does not go away.    ? Blood in your stool (feces). This may cause stool to look black and tarry.    ? Not urinating, or urinating only a small amount of very dark urine, within 6?8 hours.       Trouble breathing.    These symptoms may represent a serious problem that is an emergency. Do not wait to see if the symptoms will go away. Get medical help right away. Call your local emergency services (911 in the [place] Do not drive yourself to the hospital.      Summary     Dehydration is a condition in which there is not enough water or other fluids in the body. This happens when a person loses more fluids than he or she takes in.     Treatment for this condition depends on how severe it is. Treatment should be started right away. Do not wait until dehydration becomes severe.     Drink enough clear fluid to keep your urine pale yellow. If you were told to drink an oral rehydration solution (ORS), finish the ORS first and then start slowly drinking other clear fluids.     Take over-the-counter and prescription medicines only as told by your health care provider.     Get help right away if you have any symptoms of severe dehydration.      This information is not intended to replace advice given to you by your health care provider. Make sure you discuss any questions you have with your health care provider.      Document Revised: 06/17/2019 Document Reviewed: 06/17/2019  Elsevier Patient Education ? 2021 [employer]       Pharmacology     Accidental Drug Poisoning, Adult    Accidental drug poisoning happens when a person accidentally takes too much of a substance, such as a prescription medicine, an over-the-counter medicine, a vitamin, a supplement, or an illegal drug. The effects of drug poisoning can be mild, dangerous, or even deadly.      What are the causes?    This condition is caused by taking too much of a medicine, illegal drug, or other substance. It often results from:   Lack of knowledge  about a substance.     Using more than one substance at the same time.     An error made by the health care provider who prescribed the substance.     An error made by the pharmacist who filled the prescription.     A lapse in memory, such as forgetting that you have already taken a dose of the medicine.     Suddenly using a substance after a long period of not using it.      The following substances and medicines are more likely to cause an accidental drug poisoning:   Medicines that treat mental problems (psychotropic medicines).     Pain medicines.     Cocaine.     Heroin.     Multivitamins that contain iron.     Over-the-counter cold and cough medicines.        What increases the risk?    This condition is more likely to occur in:   Elderly adults. Elderly adults are at risk because they may:  ? Be taking many different medicines.    ? Have difficulty reading labels.    ? Forget when they last took their medicine.       People who use illegal drugs.     People who drink alcohol while using illegal drugs or certain medicines.     People with certain mental health conditions.        What are the signs or symptoms?    Symptoms of this condition depend on the substance and the amount that was taken. Common symptoms include:   Behavior changes, such as confusion.     Sleepiness.     Weakness.     Slowed breathing.     Nausea and vomiting.     Seizures.     Very large or small eye pupil size.      A drug poisoning can cause a very  serious condition in which your blood pressure drops to a low level (shock). Symptoms of shock include:   Cold and clammy skin.     Pale skin.     Blue lips.     Very slow breathing.     Extreme sleepiness.     Severe confusion.     Dizziness or fainting.        How is this diagnosed?    This condition is diagnosed based on:   Your symptoms. You will be asked about the substances you took and when you took them.     A physical exam.      You may also have other tests, including:   Urine tests.     Blood tests.     An electrocardiogram (ECG).        How is this treated?    This condition may need to be treated right away at the hospital. Treatment may involve:   Getting fluids and electrolytes through an IV.     Having a breathing tube inserted in your airway (endotracheal tube) to help you breathe.     Taking medicines. These may include medicines that:  ? Absorb any substance that is in your digestive system.    ? Block or reverse the effect of the substance that caused the drug poisoning.       Having your blood filtered through an artificial kidney machine (hemodialysis).     Ongoing counseling and mental health support. This may be provided if you used an illegal drug.  Follow these instructions at home:    Medicines       Take over-the-counter and prescription medicines only as told by your health care provider.     Before taking a new medicine, ask your health care provider whether the medicine:  ? May cause side effects.    ? Might react with other medicines.       Keep a list of all the medicines that you take, including over-the-counter medicines, vitamins, supplements, and herbs. Bring this list with you to all of your medical visits.      General instructions       Drink enough fluid to keep your urine pale yellow.     If you are working with a counselor or mental health professional, make sure to follow his or her instructions.     Do not drink alcohol if:  ? Your health care provider tells you not  to drink.    ? You are pregnant, may be pregnant, or are planning to become pregnant.       If you drink alcohol, limit how much you have:  ? 0?1 drink a day for women.    ? 0?2 drinks a day for men.       Be aware of how much alcohol is in your drink. In the [place] one drink equals one typical bottle of beer (12 oz), one-half glass of wine (5 oz), or one shot of hard liquor (1? oz).     Keep all follow-up visits as told by your health care provider. This is important.        How is this prevented?       Get help if you are struggling with:  ? Alcohol or drug use.    ? Depression or another mental health problem.       Keep the phone number of your local poison control center near your phone or on your cell phone. The hotline of the [employer] is (800337-311-3737.     Store all medicines in safety containers that are out of the reach of children.     Read the drug inserts that come with your medicines.     Create a system for taking your medicine, such as a pillbox, that will help you avoid taking too much of the medicine.     Do not drink alcohol while taking medicines unless your health care provider approves.     Do not use illegal drugs.     Do not take medicines that are not prescribed for you.      Contact a health care provider if:     Your symptoms return.     You develop new symptoms or side effects after taking a medicine.     You have questions about possible drug poisoning. Call your local poison control center at [phone].      Get help right away if:     You think that you or someone else may have taken too much of a substance.     You or someone else is having symptoms of drug poisoning.      Summary     Accidental drug poisoning happens when a person accidentally takes too much of a substance, such as a prescription medicine, an over-the-counter medicine, a vitamin, a supplement, or an illegal drug.     The effects of drug poisoning can be mild, dangerous, or  even deadly.  This condition is diagnosed based on your symptoms and a physical exam. You will be asked to tell your health care provider which substances you took and when you took them.     This condition may need to be treated right away at the hospital.      This information is not intended to replace advice given to you by your health care provider. Make sure you discuss any questions you have with your health care provider.      Document Revised: 10/17/2017 Document Reviewed: 10/06/2017  Elsevier Patient Education ? 2021 [employer]

## 2021-08-23 NOTE — Discharge Summary (Signed)
 ED Clinical Summary                     [facility]  [address]  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 Refills: 0.  Last Dose:____________________  ondansetron (Zofran 4 mg oral tablet) 1 Tabs Oral (given by mouth) every 8 hours as needed as needed for nausea/vomiting for 3 Days. Refills: 0.  Last Dose:____________________      Medications Administered During Visit:                Medication Dose Route   ondansetron 4 mg IV Push   Dextrose 5% with 0.9% NaCl 1000 mL IV Piggyback               Allergies      No Known Allergies      Major Tests and Procedures:  The following procedures and tests were performed during your ED visit.  COMMON PROCEDURES%>  COMMON PROCEDURES COMMENTS%>                PROVIDER INFORMATION               Provider Role Madeline Bay   [facility], WADE-MD ED Provider 08/23/2021 99:91:77    Georgi RN, Marita ED Nurse 08/23/2021 00:25:44        Attending Physician:  DANTON,  WADE-MD      Admit Doc  DEHNKAMP,  WADE-MD     Consulting Doc       VITALS INFORMATION  Vital Sign Triage Latest   Temp Oral ORAL_1%> ORAL%>   Temp Temporal TEMPORAL_1%> TEMPORAL%>   Temp Intravascular INTRAVASCULAR_1%> INTRAVASCULAR%>   Temp Axillary AXILLARY_1%> AXILLARY%>   Temp Rectal RECTAL_1%> RECTAL%>   02 Sat 99 % 100 %   Respiratory Rate RATE_1%> RATE%>   Peripheral Pulse Rate PULSE  RATE_1%> PULSE RATE%>   Apical Heart Rate HEART RATE_1%> HEART RATE%>   Blood Pressure BLOOD PRESSURE_1%>/ BLOOD PRESSURE_1%>51 mmHg BLOOD PRESSURE%> / BLOOD PRESSURE%>53 mmHg                 Immunizations      No Immunizations Documented This Visit          DISCHARGE INFORMATION   Discharge Disposition: H Outpt-Sent Home   Discharge Location:  Home   Discharge Date and Time:  08/23/2021 06:32:39   ED Checkout Date and Time:  08/23/2021 06:32:39     DEPART REASON INCOMPLETE INFORMATION               Depart Action Incomplete Reason   Interactive View/I&O Recently assessed               Problems      Active           No Chronic Problems  Smoking Status      No Smoking Status Documented         PATIENT EDUCATION INFORMATION  Instructions:     Accidental Drug Poisoning, Adult; Rehydration, Adult; Dehydration, Adult     Follow up:                   With: Address: When:   Follow up with primary care provider  Within 1 to 2 days   Comments:   If need assistance in establishing a primary care doctor call [phone]              ED PROVIDER DOCUMENTATION     Patient:   Juan Levine, Juan Levine             MRN: [id number]            FIN: [id number]               Age:   25 years     Sex:  Male     DOB:  [birth year]   Associated Diagnoses:   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 Has been vomiting since. Pt vomiting in triage and has vomit on shirt. Pt A&Ox 4. States he feels a little  like he is out of his body. Pt skin cold and dry. Pt sleeping in triage but wakes to answer questions (08/23/21 00:11:00).      History of Present Illness   The patient presents with To moderate increasing nausea vomiting altered mental status after taking 19 delta 8 Gummies yesterday but has had persistent vomiting since no radiation  patient thinks that they are 25 mg Gummies.        Review of Systems   Constitutional symptoms:  No fever,    Skin symptoms:  No petechiae,    Respiratory symptoms:  No hemoptysis,    Psychiatric symptoms:  no si  .             Additional review of systems information: Other pertinent  systems reveiwed as negative  except as documented in the HPI.  SABRA      Health Status   Allergies:    Allergic Reactions (Selected)  No Known Allergies.      Past Medical/ Family/ Social History   Medical history: Reviewed as documented in chart.   Surgical history: Reviewed as documented in chart.   Family history: Not significant.   Social history: Reviewed as documented in chart.   Problem list:    No qualifying data available  , per nurse's notes.      Physical Examination               Vital Signs   Vital Signs   08/23/2021 0:11 EDT Systolic Blood Pressure 105 mmHg    Diastolic Blood Pressure 51 mmHg  LOW    Temperature Oral 35.1 degC  LOW    Heart Rate Monitored 77 bpm    Respiratory Rate 16 br/min    SpO2 99 %   .   Measurements   08/23/2021 0:22 EDT Body Mass Index est meas 23.77 kg/m2    Body Mass Index Measured 23.77 kg/m2   08/23/2021 0:11 EDT Height/Length Measured 180 cm    Weight Dosing 77 kg   .  Basic Oxygen Information   08/23/2021 0:11 EDT Oxygen Therapy Room air    SpO2 99 %   .   General:  No acute distress.   Skin:  No pallor.   Eye:  Vision grossly normal.   Ears, nose, mouth and throat:  Oral mucosa moist.   Cardiovascular:  Normal peripheral perfusion.   Respiratory:  no respiratory distress .   Neurological:  Normal speech observed.   Psychiatric:  Appropriate mood & affect, non-suicidal.       Medical Decision Making   Rationale:  Patient with delta 8 cannabinoid intoxication and toxicity resulting in nausea vomiting and dehydration patient was hydrated likely develop some ketosis as well from starvation he had significant improvement after dextrose containing IV fluids no significant lab abnormalities his nausea  and other symptoms were improving rapidly patient was counseled on avoidance of cannabinoids in the future as he could develop cannabinoid hyperemesis syndrome he was thus discharged stable condition.   Documents reviewed:  Emergency department nurses' notes, flowsheet, emergency department records, prior records, vital signs.    Results review:  Lab results : Lab View   08/23/2021 4:11 EDT Estimated Creatinine Clearance 201.37 mL/min   08/23/2021 3:48 EDT WBC 7.5 x10e3/mcL    RBC 5.13 x10e6/mcL    Hgb 14.5 g/dL    HCT 57.5 %    MCV 17.2 fL  LOW    MCH 28.3 pg    MCHC 34.2 g/dL    RDW 87.3 %    Platelet 210 x10e3/mcL    MPV 9.0 fL    Neutro Auto 78.7 %  HI    Neutro Absolute 5.9 x10e3/mcL    Immature Grans Percent 0.3 %    Immature Grans Absolute 0.02 x10e3/mcL    Lymph Auto 13.8 %  LOW    Lymph Absolute 1.0 x10e3/mcL    Mono Auto 7.0 %    Mono Absolute 0.5 x10e3/mcL    Eosinophil Percent 0.1 %    Eos Absolute 0.0 x10e3/mcL    Basophil Auto 0.1 %    Baso Absolute 0.0 x10e3/mcL    NRBC Absolute Auto 0.000 x10e3/mcL    NRBC Percent Auto 0.0 %    Sodium Lvl 137 mmol/L    Potassium Lvl 3.7 mmol/L    Chloride 102 mmol/L    CO2 25 mmol/L    Glucose Random 99 mg/dL    BUN 9 mg/dL    Creatinine Lvl 0.6 mg/dL  LOW    AGAP 10 mmol/L    Osmolality Calc 272 mOsm/kg    Calcium Lvl 8.7 mg/dL    Protein Total 6.7 g/dL    Albumin Lvl 4.0 g/dL    Globulin Calc 2.7 g/dL    AG Ratio Calc 8.51    Alk Phos 59 unit/L    AST 22 unit/L    ALT 22 unit/L    eGFR 138 mL/min/1.37m    Bili Total 0.70 mg/dL   89/01/7976 8:81 EDT Glucose POC 92.0 mg/dL   .      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    Disposition: Medically  cleared, Discharged: to home.  Prescriptions: Launch prescriptions   Pharmacy:  Zofran 4 mg oral tablet (Prescribe): 4 mg, 1 tabs, Oral, q8hr, for 3 days, PRN: as needed for nausea/vomiting, 12 tabs, 0 Refill(s).    Patient was given the following educational materials: Dehydration, Adult, Rehydration, Adult, Accidental Drug Poisoning, Adult, Accidental Drug Poisoning, Adult, Rehydration, Adult, Dehydration, Adult.    Follow up with: Follow up with primary care provider Within 1 to 2 days If need assistance in establishing a primary care doctor call [phone].    Notes: The patient was counseled at length by the physician and nursing staff on the disposition  plans well as  any pertinent laboratory and radiology findings  were reviewed with the patient and or family.  They  were informed that there results  may be preliminary and that any  discrepencies  would conveyed  to them via  the contact number they  provided to the registration staff.  SABRA

## 2021-08-23 NOTE — ED Provider Notes (Signed)
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 Has been vomiting since. Pt vomiting in triage and has vomit on shirt. Pt A&Ox 4. States he feels "a little  like he is out of his body". Pt skin cold and dry. Pt sleeping in triage but wakes to answer questions (08/23/21 00:11:00).      History of Present Illness   The patient presents with To moderate increasing nausea vomiting altered mental status after taking 19 delta 8 Gummies yesterday but has had persistent vomiting since no radiation patient thinks that they are 25 mg Gummies.        Review of Systems   Constitutional symptoms:  No fever,    Skin symptoms:  No petechiae,    Respiratory symptoms:  No hemoptysis,    Psychiatric symptoms:  no si  .             Additional review of systems information: Other pertinent  systems reveiwed as negative  except as documented in the HPI.  Marland Kitchen      Health Status   Allergies:    Allergic Reactions (Selected)  No Known Allergies.      Past Medical/ Family/ Social History   Medical history: Reviewed as documented in chart.   Surgical history: Reviewed as documented in chart.   Family history: Not significant.   Social history: Reviewed as documented in chart.   Problem list:    No qualifying data available  , per nurse's notes.      Physical Examination               Vital Signs   Vital Signs   53/04/6439 3:47 EDT Systolic Blood Pressure 425 mmHg    Diastolic Blood Pressure 51 mmHg  LOW    Temperature Oral 35.1 degC  LOW    Heart Rate Monitored 77 bpm    Respiratory Rate 16 br/min    SpO2 99 %   .   Measurements   08/23/2021  0:22 EDT Body Mass Index est meas 23.77 kg/m2    Body Mass Index Measured 23.77 kg/m2   08/23/2021 0:11 EDT Height/Length Measured 180 cm    Weight Dosing 77 kg   .   Basic Oxygen Information   08/23/2021 0:11 EDT Oxygen Therapy Room air    SpO2 99 %   .   General:  No acute distress.   Skin:  No pallor.   Eye:  Vision grossly normal.   Ears, nose, mouth and throat:  Oral mucosa moist.   Cardiovascular:  Normal peripheral perfusion.   Respiratory:  no respiratory distress .   Neurological:  Normal speech observed.   Psychiatric:  Appropriate mood & affect, non-suicidal.       Medical Decision Making   Rationale:  Patient with delta  8 cannabinoid intoxication and toxicity resulting in nausea vomiting and dehydration patient was hydrated likely develop some ketosis as well from starvation he had significant improvement after dextrose containing IV fluids no significant lab abnormalities his nausea and other symptoms were improving rapidly patient was counseled on avoidance of cannabinoids in the future as he could develop cannabinoid hyperemesis syndrome he was thus discharged stable condition.   Documents reviewed:  Emergency department nurses' notes, flowsheet, emergency department records, prior records, vital signs.    Results review:  Lab results : Lab View   08/23/2021 4:11 EDT Estimated Creatinine Clearance 201.37 mL/min   08/23/2021 3:48 EDT WBC 7.5 x10e3/mcL    RBC 5.13 x10e6/mcL    Hgb 14.5 g/dL    HCT 42.4 %    MCV 82.7 fL  LOW    MCH 28.3 pg    MCHC 34.2 g/dL    RDW 12.6 %    Platelet 210 x10e3/mcL    MPV 9.0 fL    Neutro Auto 78.7 %  HI    Neutro Absolute 5.9 x10e3/mcL    Immature Grans Percent 0.3 %    Immature Grans Absolute 0.02 x10e3/mcL    Lymph Auto 13.8 %  LOW    Lymph Absolute 1.0 x10e3/mcL    Mono Auto 7.0 %    Mono Absolute 0.5 x10e3/mcL    Eosinophil Percent 0.1 %    Eos Absolute 0.0 x10e3/mcL    Basophil Auto 0.1 %    Baso Absolute 0.0 x10e3/mcL    NRBC Absolute Auto 0.000 x10e3/mcL    NRBC  Percent Auto 0.0 %    Sodium Lvl 137 mmol/L    Potassium Lvl 3.7 mmol/L    Chloride 102 mmol/L    CO2 25 mmol/L    Glucose Random 99 mg/dL    BUN 9 mg/dL    Creatinine Lvl 0.6 mg/dL  LOW    AGAP 10 mmol/L    Osmolality Calc 272 mOsm/kg    Calcium Lvl 8.7 mg/dL    Protein Total 6.7 g/dL    Albumin Lvl 4.0 g/dL    Globulin Calc 2.7 g/dL    AG Ratio Calc 1.48    Alk Phos 59 unit/L    AST 22 unit/L    ALT 22 unit/L    eGFR 138 mL/min/1.20m???    Bili Total 0.70 mg/dL   08/23/2021 1:18 EDT Glucose POC 92.0 mg/dL   .      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    Disposition: Medically cleared, Discharged: to home.    Prescriptions: Launch prescriptions   Pharmacy:  Zofran 4 mg oral tablet (Prescribe): 4 mg, 1 tabs, Oral, q8hr, for 3 days, PRN: as needed for nausea/vomiting, 12 tabs, 0 Refill(s).    Patient was given the following educational materials: Dehydration, Adult, Rehydration, Adult, Accidental Drug Poisoning, Adult, Accidental Drug Poisoning, Adult, Rehydration, Adult, Dehydration, Adult.    Follow up with: Follow up with primary care provider Within 1 to 2 days If need assistance in establishing a primary care doctor call [phone]    Notes: The patient was counseled at length by the  physician and nursing staff on the disposition  plans well as  any pertinent laboratory and radiology findings  were reviewed with the patient and or family.  They  were informed that there results  may be preliminary and that any  discrepencies  would conveyed  to them via  the contact number they  provided to the registration staff.  Regulatory affairs officer Signed on 08/23/2021 06:16 AM  EDT   ________________________________________________   Elayne Guerin               Modified byScherrie November,  Karinna Beadles-MD on 08/23/2021 06:16 AM EDT

## 2021-08-23 NOTE — ED Notes (Signed)
 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 The following includes patient education materials and information regarding your injury/illness.     Follow-up Instructions:  You were seen today on an emergency basis. Please contact your primary care doctor for a follow up appointment. If you received a referral to a specialist doctor, it is important you follow-up as instructed.    It is important that you call your follow-up doctor to schedule and confirm the location of your next appointment. Your doctor may practice at multiple locations. The office location of your follow-up appointment may be different to the one written on your discharge instructions.    If you do not have a primary care doctor, please call [phone] for help in finding a [name] Provider. For help in finding a specialist doctor, please call [phone].    If your condition gets worse before your follow-up with your primary care doctor or specialist, please return to the Emergency Department.      Coronavirus 2019 (COVID-19) Reminders:     Patients age 70 - 35, with parental consent, and patients over age 54 can make an appointment for a COVID-19 vaccine. Patients can contact their [facility] doctors' offices to schedule an  appointment to receive the COVID-19 vaccine. Patients who do not have a [facility] physician can call [phone] to schedule vaccination appointments.      Follow Up Appointments:  Primary Care Provider:     Name: PCP,  NONE     Phone:                  With: Address: When:   Follow up with primary care provider  Within 1 to 2 days   Comments:   If need assistance in establishing a primary care doctor call [phone]              [facility]>          New Medications  Printed Prescriptions  Misc Rx Supply (work excuse) work excuse 2 days. Refills: 0.  Last Dose:____________________  ondansetron (Zofran 4 mg oral tablet) 1 Tabs Oral (given by mouth) every 8 hours as needed as needed for nausea/vomiting for 3 Days. Refills: 0.  Last Dose:____________________      Allergy Info: No Known Allergies     Discharge Additional Information          Discharge Patient 08/23/21 6:19:00 EDT      Patient Education Materials:  Accidental Drug Poisoning, Adult    Accidental drug poisoning happens when a person accidentally takes too much of a substance, such as a prescription medicine, an over-the-counter medicine, a vitamin, a supplement, or an illegal drug. The effects of drug poisoning can be mild, dangerous, or even deadly.      What are the causes?    This condition is caused by taking too much of a medicine, illegal drug, or other substance. It often results from:   Lack of knowledge about a substance.     Using more than one substance at the same time.     An error made by the health care provider who prescribed the substance.     An error made by the pharmacist who filled the prescription.     A lapse in memory, such as forgetting that you have already taken a dose of the medicine.     Suddenly using a substance after a long period of not using it.      The following substances and medicines are more likely to cause an accidental drug poisoning:   Medicines that treat mental  problems (psychotropic medicines).     Pain medicines.     Cocaine.     Heroin.     Multivitamins that contain iron.     Over-the-counter cold and cough medicines.        What increases the risk?    This condition is more likely to occur in:   Elderly adults. Elderly adults are at risk because they may:  ? Be taking many different medicines.    ? Have difficulty reading labels.    ? Forget when they last took their medicine.       People who use illegal drugs.     People who drink alcohol while using illegal drugs or certain medicines.     People with certain mental health conditions.        What are the signs or symptoms?    Symptoms of this condition depend on the substance and the amount that was taken. Common symptoms include:   Behavior changes, such as confusion.     Sleepiness.     Weakness.     Slowed breathing.     Nausea and vomiting.     Seizures.     Very large or small eye pupil size.      A drug poisoning can cause a very serious condition in which your blood pressure drops to a low level (shock). Symptoms of shock include:   Cold and clammy skin.     Pale skin.     Blue lips.     Very slow breathing.     Extreme sleepiness.     Severe confusion.     Dizziness or fainting.        How is this diagnosed?    This condition is diagnosed based on:   Your symptoms. You will be asked about the substances you took and when you took them.     A physical exam.      You may also have other tests, including:   Urine tests.     Blood tests.     An electrocardiogram (ECG).        How is this treated?    This condition may need to be treated right away at the hospital. Treatment may involve:   Getting fluids and electrolytes through an IV.     Having a breathing tube inserted  in your airway (endotracheal tube) to help you breathe.     Taking medicines. These may include medicines that:  ? Absorb any substance that is in your digestive system.    ? Block or reverse the effect of the substance that caused the drug  poisoning.       Having your blood filtered through an artificial kidney machine (hemodialysis).     Ongoing counseling and mental health support. This may be provided if you used an illegal drug.        Follow these instructions at home:    Medicines       Take over-the-counter and prescription medicines only as told by your health care provider.     Before taking a new medicine, ask your health care provider whether the medicine:  ? May cause side effects.    ? Might react with other medicines.       Keep a list of all the medicines that you take, including over-the-counter medicines, vitamins, supplements, and herbs. Bring this list with you to all of your medical visits.      General instructions       Drink enough fluid to keep your urine pale yellow.     If you are working with a counselor or mental health professional, make sure to follow his or her instructions.     Do not drink alcohol if:  ? Your health care provider tells you not to drink.    ? You are pregnant, may be pregnant, or are planning to become pregnant.       If you drink alcohol, limit how much you have:  ? 0?1 drink a day for women.    ? 0?2 drinks a day for men.       Be aware of how much alcohol is in your drink. In the [place] one drink equals one typical bottle of beer (12 oz), one-half glass of wine (5 oz), or one shot of hard liquor (1? oz).     Keep all follow-up visits as told by your health care provider. This is important.        How is this prevented?       Get help if you are struggling with:  ? Alcohol or drug use.    ? Depression or another mental health problem.       Keep the phone number of your local poison control center near your phone or on your cell phone. The hotline of the [employer] is (800726-183-6478.     Store all medicines in safety containers that are out of the reach of children.     Read the drug inserts that come with your medicines.     Create a system for taking your medicine,  such as a pillbox, that will help you avoid taking too much of the medicine.     Do not drink alcohol while taking medicines unless your health care provider approves.     Do not use illegal drugs.     Do not take medicines that are not prescribed for you.      Contact a health care provider if:     Your symptoms return.     You develop new symptoms or side effects after taking a medicine.     You have questions about possible drug poisoning. Call your local poison control center at [phone].      Get help right away if:  You think that you or someone else may have taken too much of a substance.     You or someone else is having symptoms of drug poisoning.      Summary     Accidental drug poisoning happens when a person accidentally takes too much of a substance, such as a prescription medicine, an over-the-counter medicine, a vitamin, a supplement, or an illegal drug.     The effects of drug poisoning can be mild, dangerous, or even deadly.     This condition is diagnosed based on your symptoms and a physical exam. You will be asked to tell your health care provider which substances you took and when you took them.     This condition may need to be treated right away at the hospital.      This information is not intended to replace advice given to you by your health care provider. Make sure you discuss any questions you have with your health care provider.      Document Revised: 10/17/2017 Document Reviewed: 10/06/2017  Elsevier Patient Education ? 2021 Elsevier Inc.       Rehydration, Adult      Rehydration is the replacement of body fluids, salts, and minerals (electrolytes) that are lost during dehydration. Dehydration is when there is not enough water or other fluids in the body. This happens when you lose more fluids than you take in. Common causes of dehydration include:   Not drinking enough fluids. This can occur when you are ill or doing activities that require a lot of energy, especially in hot  weather.     Conditions that cause loss of water or other fluids, such as diarrhea, vomiting, sweating, or urinating a lot.     Other illnesses, such as fever or infection.     Certain medicines, such as those that remove excess fluid from the body (diuretics).      Symptoms of mild or moderate dehydration may include thirst, dry lips and mouth, and dizziness. Symptoms of severe dehydration may include increased heart rate, confusion, fainting, and not urinating.    For severe dehydration, you may need to get fluids through an IV at the hospital. For mild or moderate dehydration, you can usually rehydrate at home by drinking certain fluids as told by your health care provider.      What are the risks?    Generally, rehydration is safe. However, taking in too much fluid (overhydration) can be a problem. This is rare. Overhydration can cause an electrolyte imbalance, kidney failure, or a decrease in salt (sodium) levels in the body.      Supplies needed    You will need an oral rehydration solution (ORS) if your health care provider tells you to use one. This is a drink to treat dehydration. It can be found in pharmacies and retail stores.      How to rehydrate    Fluids    Follow instructions from your health care provider for rehydration. The kind of fluid and the amount you should drink depend on your condition. In general, you should choose drinks that you prefer.   If told by your health care provider, drink an ORS.  ? Make an ORS by following instructions on the package.    ? Start by drinking small amounts, about ? cup (120 mL) every 5?10 minutes.    ? Slowly increase how much you drink until you have taken the amount recommended by your health care provider.  Drink enough clear fluids to keep your urine pale yellow. If you were told to drink an ORS, finish it first, then start slowly drinking other clear fluids. Drink fluids such as:  ? Water. This includes sparkling water and flavored water. Drinking  only water can lead to having too little sodium in your body (hyponatremia). Follow the advice of your health care provider.    ? Water from ice chips you suck on.    ? Fruit juice with water you add to it (diluted).    ? Sports drinks.    ? Hot or cold herbal teas.    ? Broth-based soups.    ? Milk or milk products.          Food      Follow instructions from your health care provider about what to eat while you rehydrate. Your health care provider may recommend that you slowly begin eating regular foods in small amounts.   Eat foods that contain a healthy balance of electrolytes, such as bananas, oranges, potatoes, tomatoes, and spinach.     Avoid foods that are greasy or contain a lot of sugar.      In some cases, you may get nutrition through a feeding tube that is passed through your nose and into your stomach (nasogastric tube, or NG tube). This may be done if you have uncontrolled vomiting or diarrhea.      Beverages to avoid      Certain beverages may make dehydration worse. While you rehydrate, avoid drinking alcohol.        How to tell if you are recovering from dehydration    You may be recovering from dehydration if:   You are urinating more often than before you started rehydrating.     Your urine is pale yellow.     Your energy level improves.     You vomit less frequently.     You have diarrhea less frequently.     Your appetite improves or returns to normal.     You feel less dizzy or less light-headed.     Your skin tone and color start to look more normal.        Follow these instructions at home:     Take over-the-counter and prescription medicines only as told by your health care provider.     Do not take sodium tablets. Doing this can lead to having too much sodium in your body (hypernatremia).      Contact a health care provider if:     You continue to have symptoms of mild or moderate dehydration, such as:  ? Thirst.    ? Dry lips.    ? Slightly dry mouth.    ? Dizziness.    ? Dark urine or  less urine than normal.    ? Muscle cramps.       You continue to vomit or have diarrhea.      Get help right away if you:     Have symptoms of dehydration that get worse.     Have a fever.     Have a severe headache.     Have been vomiting and the following happens:  ? Your vomiting gets worse or does not go away.    ? Your vomit includes blood or green matter (bile).    ? You cannot eat or drink without vomiting.       Have problems with urination or bowel movements, such as:  ?  Diarrhea that gets worse or does not go away.    ? Blood in your stool (feces). This may cause stool to look black and tarry.    ? Not urinating, or urinating only a small amount of very dark urine, within 6?8 hours.       Have trouble breathing.     Have symptoms that get worse with treatment.    These symptoms may represent a serious problem that is an emergency. Do not wait to see if the symptoms will go away. Get medical help right away. Call your local emergency services (911 in the [place] Do not drive yourself to the hospital.      Summary     Rehydration is the replacement of body fluids and minerals (electrolytes) that are lost during dehydration.     Follow instructions from your health care provider for rehydration. The kind of fluid and amount you should drink depend on your condition.     Slowly increase how much you drink until you have taken the amount recommended by your health care provider.     Contact your health care provider if you continue to show signs of mild or moderate dehydration.      This information is not intended to replace advice given to you by your health care provider. Make sure you discuss any questions you have with your health care provider.      Document Revised: 01/05/2020 Document Reviewed: 11/15/2019  Elsevier Patient Education ? 2021 Elsevier Inc.       Dehydration, Adult    Dehydration is a condition in which there is not enough water or other fluids in the body. This happens when a person loses  more fluids than he or she takes in. Important organs, such as the kidneys, brain, and heart, cannot function without a proper amount of fluids. Any loss of fluids from the body can lead to dehydration.    Dehydration can be mild, moderate, or severe. It should be treated right away to prevent it from becoming severe.      What are the causes?    Dehydration may be caused by:   Conditions that cause loss of water or other fluids, such as diarrhea, vomiting, or sweating or urinating a lot.     Not drinking enough fluids, especially when you are ill or doing activities that require a lot of energy.     Other illnesses and conditions, such as fever or infection.     Certain medicines, such as medicines that remove excess fluid from the body (diuretics).     Lack of safe drinking water.     Not being able to get enough water and food.        What increases the risk?    The following factors may make you more likely to develop this condition:   Having a long-term (chronic) illness that has not been treated properly, such as diabetes, heart disease, or kidney disease.     Being 63 years of age or older.     Having a disability.     Living in a place that is high in altitude, where thinner, drier air causes more fluid loss.     Doing exercises that put stress on your body for a long time (endurance sports).        What are the signs or symptoms?    Symptoms of dehydration depend on how severe it is.    Mild or moderate dehydration  Thirst.     Dry lips or dry mouth.     Dizziness or light-headedness, especially when standing up from a seated position.      Muscle cramps.      Dark urine. Urine may be the color of tea.      Less urine or tears produced than usual.     Headache.      Severe dehydration     Changes in skin. Your skin may be cold and clammy, blotchy, or pale. Your skin also may not return to normal after being lightly pinched and released.     Little or no tears, urine, or sweat.     Changes in vital signs,  such as rapid breathing and low blood pressure. Your pulse may be weak or may be faster than 100 beats a minute when you are sitting still.     Other changes, such as:  ? Feeling very thirsty.    ? Sunken eyes.    ? Cold hands and feet.    ? Confusion.    ? Being very tired (lethargic) or having trouble waking from sleep.    ? Short-term weight loss.    ? Loss of consciousness.          How is this diagnosed?    This condition is diagnosed based on your symptoms and a physical exam. You may have blood and urine tests to help confirm the diagnosis.      How is this treated?    Treatment for this condition depends on how severe it is. Treatment should be started right away. Do not wait until dehydration becomes severe. Severe dehydration is an emergency and needs to be treated in a hospital.   Mild or moderate dehydration can be treated at home. You may be asked to:   ? Drink more fluids.    ? Drink an oral rehydration solution (ORS). This drink helps restore proper amounts of fluids and salts and minerals in the blood (electrolytes).       Severe dehydration can be treated:  ? With IV fluids.    ? By correcting abnormal levels of electrolytes. This is often done by giving electrolytes through a tube that is passed through your nose and into your stomach (nasogastric tube, or NG tube).    ? By treating the underlying cause of dehydration.          Follow these instructions at home:    Oral rehydration solution    If told by your health care provider, drink an ORS:   Make an ORS by following instructions on the package.     Start by drinking small amounts, about ? cup (120 mL) every 5?10 minutes.     Slowly increase how much you drink until you have taken the amount recommended by your health care provider.        Eating and drinking           Drink enough clear fluid to keep your urine pale yellow. If you were told to drink an ORS, finish the ORS first and then start slowly drinking other clear fluids. Drink fluids  such as:  ? Water. Do not drink only water. Doing that can lead to hyponatremia, which is having too little salt (sodium) in the body.    ? Water from ice chips you suck on.    ? Fruit juice that you have added water to (diluted fruit juice).    ? Low-calorie sports  drinks.       Eat foods that contain a healthy balance of electrolytes, such as bananas, oranges, potatoes, tomatoes, and spinach.     Do not drink alcohol.     Avoid the following:  ? Drinks that contain a lot of sugar. These include high-calorie sports drinks, fruit juice that is not diluted, and soda.    ? Caffeine.    ? Foods that are greasy or contain a lot of fat or sugar.        General instructions     Take over-the-counter and prescription medicines only as told by your health care provider.     Do not take sodium tablets. Doing that can lead to having too much sodium in the body (hypernatremia).     Return to your normal activities as told by your health care provider. Ask your health care provider what activities are safe for you.     Keep all follow-up visits as told by your health care provider. This is important.        Contact a health care provider if:     You have muscle cramps, pain, or discomfort, such as:  ? Pain in your abdomen and the pain gets worse or stays in one area (localizes).    ? Stiff neck.       You have a rash.     You are more irritable than usual.     You are sleepier or have a harder time waking than usual.     You feel weak or dizzy.     You feel very thirsty.      Get help right away if you have:     Any symptoms of severe dehydration.     Symptoms of vomiting, such as:  ? You cannot eat or drink without vomiting.    ? Vomiting gets worse or does not go away.    ? Vomit includes blood or green matter (bile).       Symptoms that get worse with treatment.     A fever.     A severe headache.     Problems with urination or bowel movements, such as:  ? Diarrhea that gets worse or does not go away.    ? Blood in your  stool (feces). This may cause stool to look black and tarry.    ? Not urinating, or urinating only a small amount of very dark urine, within 6?8 hours.       Trouble breathing.    These symptoms may represent a serious problem that is an emergency. Do not wait to see if the symptoms will go away. Get medical help right away. Call your local emergency services (911 in the [place] Do not drive yourself to the hospital.      Summary     Dehydration is a condition in which there is not enough water or other fluids in the body. This happens when a person loses more fluids than he or she takes in.     Treatment for this condition depends on how severe it is. Treatment should be started right away. Do not wait until dehydration becomes severe.     Drink enough clear fluid to keep your urine pale yellow. If you were told to drink an oral rehydration solution (ORS), finish the ORS first and then start slowly drinking other clear fluids.     Take over-the-counter and prescription medicines only as told by your health care provider.  Get help right away if you have any symptoms of severe dehydration.      This information is not intended to replace advice given to you by your health care provider. Make sure you discuss any questions you have with your health care provider.      Document Revised: 06/17/2019 Document Reviewed: 06/17/2019  Elsevier Patient Education ? 2021 Elsevier Inc.      ---------------------------------------------------------------------------------------------------------------------  [facility] allows patients to review your COVID and other test results as well as discharge documents from any [name], Emergency Department, surgical center or outpatient lab. Test results are typically available 36 hours after the test is completed.     [facility] encourages you to self-enroll in the [facility] Patient Portal.     To begin your self-enrollment  process, please visit [email or web address] Under [facility], click on "Sign up now".     NOTE: You must be 16 years and older to use [facility] Self-Enroll online. If you are a parent, caregiver, or guardian; you need an invite to access your child's or dependent's health records. To obtain an invite, contact the Medical Records department at [phone] Monday through Friday, 8-4:30, select option 3 . If we receive your call afterhours, we will return your call the next business day.     If you have issues trying to create or access your account, contact [employer] support at [phone] available 7 days a week 24 hours a [email or web address]     Comment:
